# Patient Record
Sex: Male | Born: 1980 | Race: Black or African American | Hispanic: No | Marital: Single | Smoking: Current every day smoker
Health system: Southern US, Community
[De-identification: ages and names within clinical notes are randomized; demographics above are authoritative.]

## PROBLEM LIST (undated history)

## (undated) ENCOUNTER — Ambulatory Visit (HOSPITAL_COMMUNITY): Payer: Medicare Other

## (undated) DIAGNOSIS — E119 Type 2 diabetes mellitus without complications: Secondary | ICD-10-CM

---

## 2000-06-22 ENCOUNTER — Emergency Department (HOSPITAL_COMMUNITY): Admission: EM | Admit: 2000-06-22 | Discharge: 2000-06-23 | Payer: Self-pay | Admitting: Emergency Medicine

## 2000-07-03 ENCOUNTER — Emergency Department (HOSPITAL_COMMUNITY): Admission: EM | Admit: 2000-07-03 | Discharge: 2000-07-03 | Payer: Self-pay | Admitting: Emergency Medicine

## 2000-07-19 ENCOUNTER — Emergency Department (HOSPITAL_COMMUNITY): Admission: EM | Admit: 2000-07-19 | Discharge: 2000-07-19 | Payer: Self-pay | Admitting: Emergency Medicine

## 2000-09-30 ENCOUNTER — Emergency Department (HOSPITAL_COMMUNITY): Admission: EM | Admit: 2000-09-30 | Discharge: 2000-09-30 | Payer: Self-pay | Admitting: Emergency Medicine

## 2000-10-03 ENCOUNTER — Emergency Department (HOSPITAL_COMMUNITY): Admission: EM | Admit: 2000-10-03 | Discharge: 2000-10-03 | Payer: Self-pay | Admitting: Emergency Medicine

## 2000-10-03 ENCOUNTER — Encounter: Payer: Self-pay | Admitting: Emergency Medicine

## 2000-10-04 ENCOUNTER — Emergency Department (HOSPITAL_COMMUNITY): Admission: EM | Admit: 2000-10-04 | Discharge: 2000-10-04 | Payer: Self-pay | Admitting: Emergency Medicine

## 2001-01-31 ENCOUNTER — Emergency Department (HOSPITAL_COMMUNITY): Admission: EM | Admit: 2001-01-31 | Discharge: 2001-01-31 | Payer: Self-pay | Admitting: Emergency Medicine

## 2001-12-29 ENCOUNTER — Emergency Department (HOSPITAL_COMMUNITY): Admission: EM | Admit: 2001-12-29 | Discharge: 2001-12-29 | Payer: Self-pay | Admitting: Emergency Medicine

## 2001-12-29 ENCOUNTER — Encounter: Payer: Self-pay | Admitting: Emergency Medicine

## 2014-10-06 ENCOUNTER — Encounter: Payer: Self-pay | Admitting: Dietician

## 2014-11-03 ENCOUNTER — Ambulatory Visit: Payer: Self-pay | Admitting: Dietician

## 2014-12-01 ENCOUNTER — Ambulatory Visit: Payer: Self-pay | Admitting: Dietician

## 2016-01-29 ENCOUNTER — Encounter (HOSPITAL_COMMUNITY): Payer: Self-pay | Admitting: Emergency Medicine

## 2016-01-29 ENCOUNTER — Emergency Department (HOSPITAL_COMMUNITY)
Admission: EM | Admit: 2016-01-29 | Discharge: 2016-01-29 | Disposition: A | Discharge status: Home / Self Care | Payer: Medicare Other | Attending: Emergency Medicine | Admitting: Emergency Medicine

## 2016-01-29 ENCOUNTER — Other Ambulatory Visit: Payer: Self-pay

## 2016-01-29 DIAGNOSIS — F17210 Nicotine dependence, cigarettes, uncomplicated: Secondary | ICD-10-CM | POA: Insufficient documentation

## 2016-01-29 DIAGNOSIS — Z7984 Long term (current) use of oral hypoglycemic drugs: Secondary | ICD-10-CM | POA: Diagnosis not present

## 2016-01-29 DIAGNOSIS — E119 Type 2 diabetes mellitus without complications: Secondary | ICD-10-CM | POA: Insufficient documentation

## 2016-01-29 DIAGNOSIS — Z79899 Other long term (current) drug therapy: Secondary | ICD-10-CM | POA: Insufficient documentation

## 2016-01-29 DIAGNOSIS — R000 Tachycardia, unspecified: Secondary | ICD-10-CM | POA: Insufficient documentation

## 2016-01-29 DIAGNOSIS — I10 Essential (primary) hypertension: Secondary | ICD-10-CM | POA: Diagnosis present

## 2016-01-29 HISTORY — DX: Type 2 diabetes mellitus without complications: E11.9

## 2016-01-29 LAB — CBC WITH DIFFERENTIAL/PLATELET
Basophils Absolute: 0 10*3/uL (ref 0.0–0.1)
Basophils Relative: 0 %
Eosinophils Absolute: 0 10*3/uL (ref 0.0–0.7)
Eosinophils Relative: 0 %
HCT: 43.6 % (ref 39.0–52.0)
Hemoglobin: 15.1 g/dL (ref 13.0–17.0)
Lymphocytes Relative: 18 %
Lymphs Abs: 2.6 10*3/uL (ref 0.7–4.0)
MCH: 27.9 pg (ref 26.0–34.0)
MCHC: 34.6 g/dL (ref 30.0–36.0)
MCV: 80.4 fL (ref 78.0–100.0)
Monocytes Absolute: 0.7 10*3/uL (ref 0.1–1.0)
Monocytes Relative: 5 %
Neutro Abs: 11 10*3/uL — ABNORMAL HIGH (ref 1.7–7.7)
Neutrophils Relative %: 77 %
Platelets: 231 10*3/uL (ref 150–400)
RBC: 5.42 MIL/uL (ref 4.22–5.81)
RDW: 13.9 % (ref 11.5–15.5)
WBC: 14.3 10*3/uL — ABNORMAL HIGH (ref 4.0–10.5)

## 2016-01-29 LAB — BASIC METABOLIC PANEL
Anion gap: 9 (ref 5–15)
BUN: 12 mg/dL (ref 6–20)
CO2: 23 mmol/L (ref 22–32)
Calcium: 9.2 mg/dL (ref 8.9–10.3)
Chloride: 101 mmol/L (ref 101–111)
Creatinine, Ser: 0.98 mg/dL (ref 0.61–1.24)
GFR calc Af Amer: >60 mL/min (ref >60)
GFR calc non Af Amer: >60 mL/min (ref >60)
Glucose, Bld: 184 mg/dL — ABNORMAL HIGH (ref 65–99)
Potassium: 4 mmol/L (ref 3.5–5.1)
Sodium: 133 mmol/L — ABNORMAL LOW (ref 135–145)

## 2016-01-29 LAB — RAPID URINE DRUG SCREEN, HOSP PERFORMED
Amphetamines: NOT DETECTED
Barbiturates: NOT DETECTED
Benzodiazepines: NOT DETECTED
COCAINE: NOT DETECTED
OPIATES: NOT DETECTED
Tetrahydrocannabinol: NOT DETECTED

## 2016-01-29 LAB — CBG MONITORING, ED: Glucose-Capillary: 191 mg/dL — ABNORMAL HIGH (ref 65–99)

## 2016-01-29 LAB — TSH: TSH: 1.637 u[IU]/mL (ref 0.350–4.500)

## 2016-01-29 NOTE — ED Provider Notes (Signed)
CSN: [id number]     Arrival date & time 01/29/16  1212 History   First MD Initiated Contact with Patient 01/29/16 1220     Chief Complaint  Patient presents with  . Hypertension    Nicholas Carson is a 35 y.o. male with a history of Schizophrenia and diabetes who presents to the emergency department complaining of high blood pressure reading today. Patient reports he was at home and had a headache and his mother had him check his blood pressure. He had a wrist blood pressure cuff and his blood pressure was over 200/100. He then came to the emergency department. Currently he has no complaints. He reports his headache resolved. He denies any chest pain or shortness of breath or palpitations. No history of high blood pressure. He does not take high blood pressure medicines. Patient is a smoker. He reports his blood sugars have been normal recently. He has been compliant with his medications. He has been eating and drinking well. He denies fevers, chest pain, shortness of breath, palpitations, leg pain, abdominal pain, nausea, vomiting, diarrhea, headaches, changes to his vision, or rashes.    Patient is a 35 y.o. male presenting with hypertension. The history is provided by the patient and a relative. No language interpreter was used.  Hypertension Associated symptoms include headaches (resolved ). Pertinent negatives include no abdominal pain, chest pain, chills, congestion, coughing, fever, nausea, neck pain, numbness, rash, sore throat, vomiting or weakness.    Past Medical History  Diagnosis Date  . Diabetes mellitus without complication (HCC)    History reviewed. No pertinent past surgical history. No family history on file. Social History  Substance Use Topics  . Smoking status: Current Every Day Smoker -- 1.00 packs/day    Types: Cigarettes  . Smokeless tobacco: None  . Alcohol Use: No    Review of Systems  Constitutional: Negative for fever and chills.  HENT: Negative for congestion  and sore throat.   Eyes: Negative for pain and visual disturbance.  Respiratory: Negative for cough, shortness of breath and wheezing.   Cardiovascular: Negative for chest pain, palpitations and leg swelling.  Gastrointestinal: Negative for nausea, vomiting, abdominal pain and diarrhea.  Genitourinary: Negative for dysuria.  Musculoskeletal: Negative for back pain and neck pain.  Skin: Negative for rash.  Neurological: Positive for headaches (resolved ). Negative for dizziness, syncope, weakness, light-headedness and numbness.      Allergies  Review of patient's allergies indicates no known allergies.  Home Medications   Prior to Admission medications   Medication Sig Start Date End Date Taking? Authorizing Provider  JANUMET XR 50-1000 MG TB24 Take 1 tablet by mouth 2 (two) times daily. 01/19/16  Yes Historical Provider, MD  risperiDONE (RISPERDAL) 2 MG tablet Take 1-3 mg by mouth 2 (two) times daily. 1 mg qam 3 mg qhs 01/11/16  Yes Historical Provider, MD   BP 125/86 mmHg  Pulse 98  Temp(Src) 98.3 F (36.8 C) (Oral)  Resp 14  SpO2 96% Physical Exam  Constitutional: He is oriented to person, place, and time. He appears well-developed and well-nourished. No distress.  HENT:  Head: Normocephalic and atraumatic.  Right Ear: External ear normal.  Left Ear: External ear normal.  Mouth/Throat: Oropharynx is clear and moist.  Eyes: Conjunctivae are normal. Pupils are equal, round, and reactive to light. Right eye exhibits no discharge. Left eye exhibits no discharge.  Neck: Normal range of motion. Neck supple. No JVD present. No tracheal deviation present.  Cardiovascular: Regular rhythm, normal  heart sounds and intact distal pulses.  Exam reveals no gallop and no friction rub.   No murmur heard. Heart rate is 120. Bilateral radial pulses are intact.  Pulmonary/Chest: Effort normal and breath sounds normal. No stridor. No respiratory distress. He has no wheezes. He has no rales.   Lungs clear to auscultation bilaterally.  Abdominal: Soft. There is no tenderness. There is no guarding.  Musculoskeletal: He exhibits no edema or tenderness.  No lower extremity edema or tenderness.  Lymphadenopathy:    He has no cervical adenopathy.  Neurological: He is alert and oriented to person, place, and time. No cranial nerve deficit. Coordination normal.  Skin: Skin is warm and dry. No rash noted. He is not diaphoretic. No erythema. No pallor.  Psychiatric: His behavior is normal. His mood appears anxious.  Patient appears slightly anxious.   Nursing note and vitals reviewed.   ED Course  Procedures (including critical care time) Labs Review Labs Reviewed  BASIC METABOLIC PANEL - Abnormal; Notable for the following:    Sodium 133 (*)    Glucose, Bld 184 (*)    All other components within normal limits  CBC WITH DIFFERENTIAL/PLATELET - Abnormal; Notable for the following:    WBC 14.3 (*)    Neutro Abs 11.0 (*)    All other components within normal limits  CBG MONITORING, ED - Abnormal; Notable for the following:    Glucose-Capillary 191 (*)    All other components within normal limits  TSH  URINE RAPID DRUG SCREEN, HOSP PERFORMED    Imaging Review No results found. I have personally reviewed and evaluated these images and lab results as part of my medical decision-making.   EKG Interpretation ED ECG REPORT   Date: 01/29/2016  Rate: 106  Rhythm: sinus tachycardia  QRS Axis: normal  Intervals: normal  ST/T Wave abnormalities: early repolarization  Conduction Disutrbances:none  Narrative Interpretation:   Old EKG Reviewed: none available  I have personally reviewed the EKG tracing and agree with the computerized printout as noted.       Filed Vitals:   01/29/16 1226 01/29/16 1311  BP: 125/86   Pulse: 130 98  Temp: 98.3 F (36.8 C)   TempSrc: Oral   Resp: 18 14  SpO2: 99% 96%     MDM   Meds given in ED:  Medications - No data to  display  New Prescriptions   No medications on file    Final diagnoses:  Tachycardia    This is a 35 y.o. male with a history of Schizophrenia and diabetes who presents to the emergency department complaining of high blood pressure reading today. Patient reports he was at home and had a headache and his mother had him check his blood pressure. He had a wrist blood pressure cuff and his blood pressure was over 200/100. He then came to the emergency department. Currently he has no complaints. He reports his headache resolved. He denies any chest pain or shortness of breath or palpitations. No history of high blood pressure. He does not take high blood pressure medicines. Patient is a smoker. He reports his blood sugars have been normal recently. He has been compliant with his medications. He has been eating and drinking well. He denies fevers, chest pain, shortness of breath, palpitations.  On chart review through care everywhere the patient does have multiple vital signs documenting elevated heart rate with heart rates between 98 and 118 at several outpatient family medicine visits.  On exam the patient  is afebrile nontoxic appearing. His lungs clear to auscultation bilaterally. Abdomen soft and nontender. Heart rate is 120. He does appear slightly anxious. He denies feeling anxious. On chart review the patient has not had a TSH. Will check screening EKG, labs and TSH.  BMP is remarkable only for a blood sugar of 184. Normal anion gap. CBC shows a mild leukocytosis with a white count of 14,000. TSH is within normal limits at 1.637. Urine drug screen is unremarkable. No evidence of cocaine use. EKG shows sinus tachycardia. During the patient's ER visit his heart rate improved to 98. At reevaluation patient still has no complaints. Will discharge home and have him follow-up with his primary care provider to have his blood pressure rechecked as well as his heart rate. I discussed return precautions. I  advised the patient to follow-up with their primary care provider this week. I advised the patient to return to the emergency department with new or worsening symptoms or new concerns. The patient verbalized understanding and agreement with plan.    This patient was dicussed with Dr. Particia NearingHaviland who agrees with assessment and plan.    Everlene FarrierWilliam Kathi Dohn, PA-C 01/29/16 1429  Jacalyn LefevreJulie Haviland, MD 01/29/16 [phone]

## 2016-01-29 NOTE — Discharge Instructions (Signed)
Please follow-up with your primary care provider to have your blood pressure and pulse rechecked. Your blood pressure here today was normal to high.  Nonspecific Tachycardia Tachycardia is a faster than normal heartbeat (more than 100 beats per minute). In adults, the heart normally beats between 60 and 100 times a minute. A fast heartbeat may be a normal response to exercise or stress. It does not necessarily mean that something is wrong. However, sometimes when your heart beats too fast it may not be able to pump enough blood to the rest of your body. This can result in chest pain, shortness of breath, dizziness, and even fainting. Nonspecific tachycardia means that the specific cause or pattern of your tachycardia is unknown. CAUSES  Tachycardia may be harmless or it may be due to a more serious underlying cause. Possible causes of tachycardia include:  Exercise or exertion.  Fever.  Pain or injury.  Infection.  Loss of body fluids (dehydration).  Overactive thyroid.  Lack of red blood cells (anemia).  Anxiety and stress.  Alcohol.  Caffeine.  Tobacco products.  Diet pills.  Illegal drugs.  Heart disease. SYMPTOMS  Rapid or irregular heartbeat (palpitations).  Suddenly feeling your heart beating (cardiac awareness).  Dizziness.  Tiredness (fatigue).  Shortness of breath.  Chest pain.  Nausea.  Fainting. DIAGNOSIS  Your caregiver will perform a physical exam and take your medical history. In some cases, a heart specialist (cardiologist) may be consulted. Your caregiver may also order:  Blood tests.  Electrocardiography. This test records the electrical activity of your heart.  A heart monitoring test. TREATMENT  Treatment will depend on the likely cause of your tachycardia. The goal is to treat the underlying cause of your tachycardia. Treatment methods may include:  Replacement of fluids or blood through an intravenous (IV) tube for moderate to severe  dehydration or anemia.  New medicines or changes in your current medicines.  Diet and lifestyle changes.  Treatment for certain infections.  Stress relief or relaxation methods. HOME CARE INSTRUCTIONS   Rest.  Drink enough fluids to keep your urine clear or pale yellow.  Do not smoke.  Avoid:  Caffeine.  Tobacco.  Alcohol.  Chocolate.  Stimulants such as over-the-counter diet pills or pills that help you stay awake.  Situations that cause anxiety or stress.  Illegal drugs such as marijuana, phencyclidine (PCP), and cocaine.  Only take medicine as directed by your caregiver.  Keep all follow-up appointments as directed by your caregiver. SEEK IMMEDIATE MEDICAL CARE IF:   You have pain in your chest, upper arms, jaw, or neck.  You become weak, dizzy, or feel faint.  You have palpitations that will not go away.  You vomit, have diarrhea, or pass blood in your stool.  Your skin is cool, pale, and wet.  You have a fever that will not go away with rest, fluids, and medicine. MAKE SURE YOU:   Understand these instructions.  Will watch your condition.  Will get help right away if you are not doing well or get worse.   This information is not intended to replace advice given to you by your health care provider. Make sure you discuss any questions you have with your health care provider.   Document Released: 08/31/2004 Document Revised: 10/16/2011 Document Reviewed: 02/05/2015 Elsevier Interactive Patient Education [employer]2016 [employer] Inc.

## 2016-01-29 NOTE — ED Notes (Addendum)
Pt c/o high blood pressure (237/187) after "smoking some cigarettes and eating some bacon." Denies chest pain or SOB. Current BP 125/86. Pt denies hx of hypertension.

## 2016-01-29 NOTE — ED Notes (Signed)
Discharge instructions and follow up care reviewed with patient. Patient verbalized understanding. 

## 2017-05-23 ENCOUNTER — Inpatient Hospital Stay (HOSPITAL_COMMUNITY)
Admission: EM | Admit: 2017-05-23 | Discharge: 2017-05-26 | DRG: 917 | Disposition: A | Discharge status: Home / Self Care | Payer: Medicare Other | Attending: Family Medicine | Admitting: Family Medicine

## 2017-05-23 ENCOUNTER — Encounter (HOSPITAL_COMMUNITY): Payer: Self-pay | Admitting: Emergency Medicine

## 2017-05-23 ENCOUNTER — Emergency Department (HOSPITAL_COMMUNITY): Payer: Medicare Other

## 2017-05-23 ENCOUNTER — Inpatient Hospital Stay (HOSPITAL_COMMUNITY): Payer: Medicare Other

## 2017-05-23 DIAGNOSIS — T39091A Poisoning by salicylates, accidental (unintentional), initial encounter: Principal | ICD-10-CM | POA: Diagnosis present

## 2017-05-23 DIAGNOSIS — E119 Type 2 diabetes mellitus without complications: Secondary | ICD-10-CM

## 2017-05-23 DIAGNOSIS — N179 Acute kidney failure, unspecified: Secondary | ICD-10-CM | POA: Diagnosis not present

## 2017-05-23 DIAGNOSIS — F200 Paranoid schizophrenia: Secondary | ICD-10-CM

## 2017-05-23 DIAGNOSIS — D72829 Elevated white blood cell count, unspecified: Secondary | ICD-10-CM | POA: Diagnosis present

## 2017-05-23 DIAGNOSIS — J9600 Acute respiratory failure, unspecified whether with hypoxia or hypercapnia: Secondary | ICD-10-CM | POA: Diagnosis present

## 2017-05-23 DIAGNOSIS — Z01818 Encounter for other preprocedural examination: Secondary | ICD-10-CM

## 2017-05-23 DIAGNOSIS — E1165 Type 2 diabetes mellitus with hyperglycemia: Secondary | ICD-10-CM | POA: Diagnosis present

## 2017-05-23 DIAGNOSIS — E876 Hypokalemia: Secondary | ICD-10-CM | POA: Diagnosis not present

## 2017-05-23 DIAGNOSIS — E875 Hyperkalemia: Secondary | ICD-10-CM | POA: Diagnosis present

## 2017-05-23 DIAGNOSIS — Z6837 Body mass index (BMI) 37.0-37.9, adult: Secondary | ICD-10-CM | POA: Diagnosis not present

## 2017-05-23 DIAGNOSIS — R000 Tachycardia, unspecified: Secondary | ICD-10-CM | POA: Diagnosis present

## 2017-05-23 DIAGNOSIS — R4182 Altered mental status, unspecified: Secondary | ICD-10-CM | POA: Diagnosis present

## 2017-05-23 DIAGNOSIS — I10 Essential (primary) hypertension: Secondary | ICD-10-CM | POA: Diagnosis present

## 2017-05-23 DIAGNOSIS — E669 Obesity, unspecified: Secondary | ICD-10-CM | POA: Diagnosis present

## 2017-05-23 DIAGNOSIS — E874 Mixed disorder of acid-base balance: Secondary | ICD-10-CM | POA: Diagnosis present

## 2017-05-23 DIAGNOSIS — E781 Pure hyperglyceridemia: Secondary | ICD-10-CM | POA: Diagnosis present

## 2017-05-23 DIAGNOSIS — G9341 Metabolic encephalopathy: Secondary | ICD-10-CM | POA: Diagnosis present

## 2017-05-23 DIAGNOSIS — J9601 Acute respiratory failure with hypoxia: Secondary | ICD-10-CM

## 2017-05-23 DIAGNOSIS — T39095A Adverse effect of salicylates, initial encounter: Secondary | ICD-10-CM

## 2017-05-23 DIAGNOSIS — W010XXA Fall on same level from slipping, tripping and stumbling without subsequent striking against object, initial encounter: Secondary | ICD-10-CM | POA: Diagnosis present

## 2017-05-23 DIAGNOSIS — E872 Acidosis: Secondary | ICD-10-CM | POA: Diagnosis not present

## 2017-05-23 DIAGNOSIS — Z79899 Other long term (current) drug therapy: Secondary | ICD-10-CM | POA: Diagnosis not present

## 2017-05-23 DIAGNOSIS — Z7984 Long term (current) use of oral hypoglycemic drugs: Secondary | ICD-10-CM

## 2017-05-23 DIAGNOSIS — F17210 Nicotine dependence, cigarettes, uncomplicated: Secondary | ICD-10-CM | POA: Diagnosis present

## 2017-05-23 DIAGNOSIS — Z9911 Dependence on respirator [ventilator] status: Secondary | ICD-10-CM

## 2017-05-23 DIAGNOSIS — R739 Hyperglycemia, unspecified: Secondary | ICD-10-CM

## 2017-05-23 LAB — BASIC METABOLIC PANEL
Anion gap: 11 (ref 5–15)
BUN: 12 mg/dL (ref 6–20)
CALCIUM: 7.4 mg/dL — AB (ref 8.9–10.3)
CO2: 29 mmol/L (ref 22–32)
Chloride: 100 mmol/L — ABNORMAL LOW (ref 101–111)
Creatinine, Ser: 1.46 mg/dL — ABNORMAL HIGH (ref 0.61–1.24)
GFR calc Af Amer: >60 mL/min (ref >60)
GFR, EST NON AFRICAN AMERICAN: 60 mL/min — AB (ref >60)
GLUCOSE: 133 mg/dL — AB (ref 65–99)
Potassium: 2.4 mmol/L — CL (ref 3.5–5.1)
Sodium: 140 mmol/L (ref 135–145)

## 2017-05-23 LAB — URINALYSIS, ROUTINE W REFLEX MICROSCOPIC
BILIRUBIN URINE: NEGATIVE
BILIRUBIN URINE: NEGATIVE
Bacteria, UA: NONE SEEN
GLUCOSE, UA: 50 mg/dL — AB
GLUCOSE, UA: NEGATIVE mg/dL
HGB URINE DIPSTICK: NEGATIVE
KETONES UR: 5 mg/dL — AB
Ketones, ur: 20 mg/dL — AB
LEUKOCYTES UA: NEGATIVE
Leukocytes, UA: NEGATIVE
NITRITE: NEGATIVE
NITRITE: NEGATIVE
PH: 5 (ref 5.0–8.0)
Protein, ur: 30 mg/dL — AB
Protein, ur: 100 mg/dL — AB
SPECIFIC GRAVITY, URINE: 1.024 (ref 1.005–1.030)
Specific Gravity, Urine: 1.036 — ABNORMAL HIGH (ref 1.005–1.030)
pH: 5 (ref 5.0–8.0)

## 2017-05-23 LAB — COMPREHENSIVE METABOLIC PANEL
ALK PHOS: 57 U/L (ref 38–126)
ALT: 22 U/L (ref 17–63)
ALT: 20 U/L (ref 17–63)
ANION GAP: 19 — AB (ref 5–15)
AST: 29 U/L (ref 15–41)
AST: 16 U/L (ref 15–41)
Albumin: 3.6 g/dL (ref 3.5–5.0)
Albumin: 3.1 g/dL — ABNORMAL LOW (ref 3.5–5.0)
Alkaline Phosphatase: 47 U/L (ref 38–126)
Anion gap: 14 (ref 5–15)
BILIRUBIN TOTAL: 0.9 mg/dL (ref 0.3–1.2)
BILIRUBIN TOTAL: 0.5 mg/dL (ref 0.3–1.2)
BUN: 14 mg/dL (ref 6–20)
BUN: 14 mg/dL (ref 6–20)
CALCIUM: 8.8 mg/dL — AB (ref 8.9–10.3)
CO2: 15 mmol/L — ABNORMAL LOW (ref 22–32)
CO2: 25 mmol/L (ref 22–32)
CREATININE: 1.49 mg/dL — AB (ref 0.61–1.24)
Calcium: 7.5 mg/dL — ABNORMAL LOW (ref 8.9–10.3)
Chloride: 102 mmol/L (ref 101–111)
Chloride: 105 mmol/L (ref 101–111)
Creatinine, Ser: 1.46 mg/dL — ABNORMAL HIGH (ref 0.61–1.24)
GFR calc Af Amer: >60 mL/min (ref >60)
GFR, EST NON AFRICAN AMERICAN: 60 mL/min — AB (ref >60)
GFR, EST NON AFRICAN AMERICAN: 58 mL/min — AB (ref >60)
Glucose, Bld: 255 mg/dL — ABNORMAL HIGH (ref 65–99)
Glucose, Bld: 191 mg/dL — ABNORMAL HIGH (ref 65–99)
POTASSIUM: 5.3 mmol/L — AB (ref 3.5–5.1)
Potassium: 4.3 mmol/L (ref 3.5–5.1)
Sodium: 136 mmol/L (ref 135–145)
Sodium: 144 mmol/L (ref 135–145)
TOTAL PROTEIN: 7.9 g/dL (ref 6.5–8.1)
TOTAL PROTEIN: 6.8 g/dL (ref 6.5–8.1)

## 2017-05-23 LAB — BLOOD GAS, ARTERIAL
Acid-Base Excess: 1.1 mmol/L (ref 0.0–2.0)
Bicarbonate: 24 mmol/L (ref 20.0–28.0)
DRAWN BY: 51806
FIO2: 40
LHR: 30 {breaths}/min
O2 Saturation: 98.3 %
PEEP: 5 cmH2O
PO2 ART: 128 mmHg — AB (ref 83.0–108.0)
Patient temperature: 98.6
VT: 550 mL
pCO2 arterial: 30.5 mmHg — ABNORMAL LOW (ref 32.0–48.0)
pH, Arterial: 7.507 — ABNORMAL HIGH (ref 7.350–7.450)

## 2017-05-23 LAB — I-STAT ARTERIAL BLOOD GAS, ED
Acid-Base Excess: 3 mmol/L — ABNORMAL HIGH (ref 0.0–2.0)
Bicarbonate: 28.9 mmol/L — ABNORMAL HIGH (ref 20.0–28.0)
O2 Saturation: 100 %
PCO2 ART: 49.3 mmHg — AB (ref 32.0–48.0)
Patient temperature: 98.7
TCO2: 30 mmol/L (ref 22–32)
pH, Arterial: 7.376 (ref 7.350–7.450)
pO2, Arterial: 465 mmHg — ABNORMAL HIGH (ref 83.0–108.0)

## 2017-05-23 LAB — GLUCOSE, CAPILLARY
Glucose-Capillary: 166 mg/dL — ABNORMAL HIGH (ref 65–99)
Glucose-Capillary: 134 mg/dL — ABNORMAL HIGH (ref 65–99)
Glucose-Capillary: 135 mg/dL — ABNORMAL HIGH (ref 65–99)

## 2017-05-23 LAB — CBC WITH DIFFERENTIAL/PLATELET
BASOS ABS: 0 10*3/uL (ref 0.0–0.1)
BASOS PCT: 0 %
Eosinophils Absolute: 0 10*3/uL (ref 0.0–0.7)
Eosinophils Relative: 0 %
HEMATOCRIT: 46.9 % (ref 39.0–52.0)
Hemoglobin: 15.6 g/dL (ref 13.0–17.0)
LYMPHS PCT: 5 %
Lymphs Abs: 0.9 10*3/uL (ref 0.7–4.0)
MCH: 27.6 pg (ref 26.0–34.0)
MCHC: 33.3 g/dL (ref 30.0–36.0)
MCV: 82.9 fL (ref 78.0–100.0)
MONO ABS: 0.3 10*3/uL (ref 0.1–1.0)
Monocytes Relative: 2 %
NEUTROS ABS: 18.2 10*3/uL — AB (ref 1.7–7.7)
Neutrophils Relative %: 93 %
PLATELETS: 309 10*3/uL (ref 150–400)
RBC: 5.66 MIL/uL (ref 4.22–5.81)
RDW: 15.9 % — AB (ref 11.5–15.5)
WBC: 19.5 10*3/uL — AB (ref 4.0–10.5)

## 2017-05-23 LAB — MRSA PCR SCREENING: MRSA BY PCR: NEGATIVE

## 2017-05-23 LAB — I-STAT CG4 LACTIC ACID, ED: Lactic Acid, Venous: 2.48 mmol/L (ref 0.5–1.9)

## 2017-05-23 LAB — I-STAT VENOUS BLOOD GAS, ED
ACID-BASE DEFICIT: 3 mmol/L — AB (ref 0.0–2.0)
Bicarbonate: 17.8 mmol/L — ABNORMAL LOW (ref 20.0–28.0)
O2 SAT: 97 %
PCO2 VEN: 23.6 mmHg — AB (ref 44.0–60.0)
PO2 VEN: 78 mmHg — AB (ref 32.0–45.0)
TCO2: 18 mmol/L — ABNORMAL LOW (ref 22–32)
pH, Ven: 7.485 — ABNORMAL HIGH (ref 7.250–7.430)

## 2017-05-23 LAB — ACETAMINOPHEN LEVEL: Acetaminophen (Tylenol), Serum: <10 ug/mL — ABNORMAL LOW (ref 10–30)

## 2017-05-23 LAB — RAPID URINE DRUG SCREEN, HOSP PERFORMED
AMPHETAMINES: NOT DETECTED
BARBITURATES: NOT DETECTED
Benzodiazepines: NOT DETECTED
Cocaine: NOT DETECTED
Opiates: NOT DETECTED
Tetrahydrocannabinol: NOT DETECTED

## 2017-05-23 LAB — CBG MONITORING, ED: Glucose-Capillary: 272 mg/dL — ABNORMAL HIGH (ref 65–99)

## 2017-05-23 LAB — SALICYLATE LEVEL
SALICYLATE LVL: 116.7 mg/dL — AB (ref 2.8–30.0)
SALICYLATE LVL: 62.6 mg/dL — AB (ref 2.8–30.0)
Salicylate Lvl: 98.3 mg/dL (ref 2.8–30.0)
Salicylate Lvl: 46.9 mg/dL (ref 2.8–30.0)

## 2017-05-23 LAB — ETHANOL

## 2017-05-23 LAB — PROTIME-INR
INR: 1.89
Prothrombin Time: 21.5 seconds — ABNORMAL HIGH (ref 11.4–15.2)

## 2017-05-23 LAB — AMMONIA: AMMONIA: 49 umol/L — AB (ref 9–35)

## 2017-05-23 LAB — OSMOLALITY: OSMOLALITY: 312 mosm/kg — AB (ref 275–295)

## 2017-05-23 LAB — TSH: TSH: 0.188 u[IU]/mL — ABNORMAL LOW (ref 0.350–4.500)

## 2017-05-23 LAB — I-STAT TROPONIN, ED: TROPONIN I, POC: 0 ng/mL (ref 0.00–0.08)

## 2017-05-23 LAB — MAGNESIUM: Magnesium: 2.5 mg/dL — ABNORMAL HIGH (ref 1.7–2.4)

## 2017-05-23 LAB — TRIGLYCERIDES: Triglycerides: 134 mg/dL (ref <150)

## 2017-05-23 LAB — CK: Total CK: 375 U/L (ref 49–397)

## 2017-05-23 MED ORDER — FENTANYL CITRATE (PF) 100 MCG/2ML IJ SOLN
200.0000 ug | Freq: Once | INTRAMUSCULAR | Status: DC
  Filled 2017-05-23: qty 4

## 2017-05-23 MED ORDER — HEPARIN SODIUM (PORCINE) 5000 UNIT/ML IJ SOLN
5000.0000 [IU] | Freq: Three times a day (TID) | INTRAMUSCULAR | Status: DC
  Administered 2017-05-23 – 2017-05-26 (×9): 5000 [IU] via SUBCUTANEOUS
  Filled 2017-05-23 (×9): qty 1

## 2017-05-23 MED ORDER — PENTAFLUOROPROP-TETRAFLUOROETH EX AERO: 1.0000 "application " | INHALATION_SPRAY | CUTANEOUS | Status: DC | PRN

## 2017-05-23 MED ORDER — SODIUM BICARBONATE 8.4 % IV SOLN
150.0000 meq | Freq: Once | INTRAVENOUS | Status: AC
  Administered 2017-05-23: 150 meq via INTRAVENOUS

## 2017-05-23 MED ORDER — FENTANYL CITRATE (PF) 100 MCG/2ML IJ SOLN
INTRAMUSCULAR | Status: AC
  Filled 2017-05-23: qty 4

## 2017-05-23 MED ORDER — FENTANYL CITRATE (PF) 100 MCG/2ML IJ SOLN
50.0000 ug | Freq: Once | INTRAMUSCULAR | Status: AC
  Administered 2017-05-23: 50 ug via INTRAVENOUS

## 2017-05-23 MED ORDER — FENTANYL BOLUS VIA INFUSION
50.0000 ug | INTRAVENOUS | Status: DC | PRN
  Filled 2017-05-23: qty 50

## 2017-05-23 MED ORDER — SODIUM CHLORIDE 0.9% FLUSH
10.0000 mL | Freq: Two times a day (BID) | INTRAVENOUS | Status: DC
  Administered 2017-05-23 – 2017-05-26 (×5): 10 mL

## 2017-05-23 MED ORDER — LIDOCAINE-PRILOCAINE 2.5-2.5 % EX CREA: 1.0000 "application " | TOPICAL_CREAM | CUTANEOUS | Status: DC | PRN

## 2017-05-23 MED ORDER — LORAZEPAM 2 MG/ML IJ SOLN
INTRAMUSCULAR | Status: AC
  Filled 2017-05-23: qty 1

## 2017-05-23 MED ORDER — SODIUM CHLORIDE 0.9 % IV BOLUS (SEPSIS)
1000.0000 mL | Freq: Once | INTRAVENOUS | Status: AC
  Administered 2017-05-23: 1000 mL via INTRAVENOUS

## 2017-05-23 MED ORDER — SODIUM CHLORIDE 0.9% FLUSH
10.0000 mL | INTRAVENOUS | Status: DC | PRN
Start: 2017-05-23 — End: 2017-05-26

## 2017-05-23 MED ORDER — MIDAZOLAM HCL 2 MG/2ML IJ SOLN: 4.0000 mg | Freq: Once | INTRAMUSCULAR | Status: DC

## 2017-05-23 MED ORDER — SODIUM CHLORIDE 0.9 % IV SOLN: 250.0000 mL | INTRAVENOUS | Status: DC | PRN

## 2017-05-23 MED ORDER — ALTEPLASE 2 MG IJ SOLR: 2.0000 mg | Freq: Once | INTRAMUSCULAR | Status: DC | PRN

## 2017-05-23 MED ORDER — LORAZEPAM 2 MG/ML IJ SOLN
INTRAMUSCULAR | Status: AC
  Administered 2017-05-23: 0.5 mg
  Filled 2017-05-23: qty 1

## 2017-05-23 MED ORDER — HEPARIN SODIUM (PORCINE) 1000 UNIT/ML DIALYSIS: 1000.0000 [IU] | INTRAMUSCULAR | Status: DC | PRN

## 2017-05-23 MED ORDER — FENTANYL 2500MCG IN NS 250ML (10MCG/ML) PREMIX INFUSION
25.0000 ug/h | INTRAVENOUS | Status: DC
  Administered 2017-05-23: 50 ug/h via INTRAVENOUS
  Filled 2017-05-23 (×2): qty 250

## 2017-05-23 MED ORDER — SODIUM CHLORIDE 0.9 % IV SOLN: 100.0000 mL | INTRAVENOUS | Status: DC | PRN

## 2017-05-23 MED ORDER — CHLORHEXIDINE GLUCONATE CLOTH 2 % EX PADS
6.0000 | MEDICATED_PAD | Freq: Every day | CUTANEOUS | Status: DC
  Administered 2017-05-23 – 2017-05-24 (×2): 6 via TOPICAL

## 2017-05-23 MED ORDER — PROPOFOL 1000 MG/100ML IV EMUL
INTRAVENOUS | Status: AC
  Filled 2017-05-23: qty 100

## 2017-05-23 MED ORDER — SODIUM BICARBONATE 8.4 % IV SOLN
INTRAVENOUS | Status: AC
  Administered 2017-05-23: 12:00:00
  Filled 2017-05-23: qty 150

## 2017-05-23 MED ORDER — POTASSIUM CHLORIDE 10 MEQ/50ML IV SOLN
10.0000 meq | INTRAVENOUS | Status: AC
  Administered 2017-05-24 (×4): 10 meq via INTRAVENOUS
  Filled 2017-05-23 (×4): qty 50

## 2017-05-23 MED ORDER — MIDAZOLAM HCL 5 MG/5ML IJ SOLN
INTRAMUSCULAR | Status: DC | PRN
  Administered 2017-05-23 (×2): 2 mg via INTRAVENOUS

## 2017-05-23 MED ORDER — POTASSIUM CHLORIDE 20 MEQ/15ML (10%) PO SOLN
20.0000 meq | Freq: Once | ORAL | Status: AC
  Administered 2017-05-23: 20 meq via ORAL
  Filled 2017-05-23: qty 15

## 2017-05-23 MED ORDER — CHLORHEXIDINE GLUCONATE 0.12% ORAL RINSE (MEDLINE KIT)
15.0000 mL | Freq: Two times a day (BID) | OROMUCOSAL | Status: DC
  Administered 2017-05-23 – 2017-05-24 (×2): 15 mL via OROMUCOSAL

## 2017-05-23 MED ORDER — FENTANYL CITRATE (PF) 100 MCG/2ML IJ SOLN
INTRAMUSCULAR | Status: DC | PRN
  Administered 2017-05-23 (×3): 100 ug via INTRAVENOUS

## 2017-05-23 MED ORDER — MIDAZOLAM HCL 2 MG/2ML IJ SOLN
INTRAMUSCULAR | Status: AC
  Filled 2017-05-23: qty 4

## 2017-05-23 MED ORDER — PROPOFOL 1000 MG/100ML IV EMUL
5.0000 ug/kg/min | INTRAVENOUS | Status: DC
  Administered 2017-05-23: 30 ug/kg/min via INTRAVENOUS
  Administered 2017-05-23: 60 ug/kg/min via INTRAVENOUS
  Administered 2017-05-23: 65 ug/kg/min via INTRAVENOUS
  Administered 2017-05-23 – 2017-05-24 (×3): 30 ug/kg/min via INTRAVENOUS
  Filled 2017-05-23 (×5): qty 100

## 2017-05-23 MED ORDER — SODIUM BICARBONATE 8.4 % IV SOLN
INTRAVENOUS | Status: DC
  Administered 2017-05-23 – 2017-05-24 (×5): via INTRAVENOUS
  Filled 2017-05-23 (×11): qty 150

## 2017-05-23 MED ORDER — LIDOCAINE HCL (PF) 1 % IJ SOLN: 5.0000 mL | INTRAMUSCULAR | Status: DC | PRN

## 2017-05-23 MED ORDER — PANTOPRAZOLE SODIUM 40 MG IV SOLR
40.0000 mg | Freq: Every day | INTRAVENOUS | Status: DC
  Administered 2017-05-23 – 2017-05-24 (×2): 40 mg via INTRAVENOUS
  Filled 2017-05-23 (×2): qty 40

## 2017-05-23 MED ORDER — ORAL CARE MOUTH RINSE
15.0000 mL | Freq: Four times a day (QID) | OROMUCOSAL | Status: DC
  Administered 2017-05-23 – 2017-05-24 (×5): 15 mL via OROMUCOSAL

## 2017-05-23 NOTE — ED Provider Notes (Signed)
[facility] EMERGENCY DEPARTMENT Provider Note   CSN: [id number] Arrival date & time: 05/23/17  1001     History   Chief Complaint No chief complaint on file.   HPI Nicholas Carson is a 36 y.o. male.  HPI   Patient is a 36 year old male presenting with altered mental status. Patient was found crossing a street. He was stumbling across the street and fell at least one time witnessed by bystanders. Patient brought in by EMS. They report that he could remember his name and nor his date of birth or any other information about himself. According to police who is also with him,  he has had over 50 addresses in the last 10 years. He has had multiple IVC  out for him for psychiatric issues in the past.As well as multiple calls for indecent exposure.  On arrival patient unable to answer any questions. Protecting airway. Level 5 CAVEAT altered mental status.  Past Medical History:  Diagnosis Date  . Diabetes mellitus without complication (HCC)     There are no active problems to display for this patient.   History reviewed. No pertinent surgical history.     Home Medications    Prior to Admission medications   Medication Sig Start Date End Date Taking? Authorizing Provider  JANUMET XR 50-1000 MG TB24 Take 1 tablet by mouth 2 (two) times daily. 01/19/16   [provider]  risperiDONE (RISPERDAL) 2 MG tablet Take 1-3 mg by mouth 2 (two) times daily. 1 mg qam 3 mg qhs 01/11/16   [provider]    Family History History reviewed. No pertinent family history.  Social History Social History  Substance Use Topics  . Smoking status: Current Every Day Smoker    Packs/day: 1.00    Types: Cigarettes  . Smokeless tobacco: Never Used  . Alcohol use No     Allergies   Patient has no known allergies.   Review of Systems Review of Systems  Unable to perform ROS: Mental status change     Physical Exam Updated Vital Signs There were no vitals  taken for this visit.  Physical Exam  Constitutional: He appears well-nourished.  Morbidly obese diaphoretic male.  HENT:  Head: Normocephalic and atraumatic.  Mid sized pupils  Eyes: Conjunctivae are normal. Right eye exhibits no discharge. Left eye exhibits no discharge.  Cardiovascular: Normal rate and regular rhythm.   No murmur heard. Pulmonary/Chest: Effort normal and breath sounds normal. No respiratory distress. He has no wheezes.  Abdominal: Soft. He exhibits no distension. There is no tenderness.  Neurological: He is alert. No cranial nerve deficit.  Patient has snoring respirations however if you ask him to squeeze his hands he will do so and he will answer questions in a whisper voice.he  Can move all 4 extremities. No cranial nerve deficits. Mild nystagmus However when not being talked to patient is back to snoring respirations.  No clonus. Patient does have tongue fasciculations.  Skin: Skin is warm and dry. He is not diaphoretic.     ED Treatments / Results  Labs (all labs ordered are listed, but only abnormal results are displayed) Labs Reviewed  CBG MONITORING, ED - Abnormal; Notable for the following:       Result Value   Glucose-Capillary 272 (*)    All other components within normal limits  COMPREHENSIVE METABOLIC PANEL  CBC WITH DIFFERENTIAL/PLATELET  URINALYSIS, ROUTINE W REFLEX MICROSCOPIC  RAPID URINE DRUG SCREEN, HOSP PERFORMED  AMMONIA  ETHANOL  I-STAT CG4 LACTIC ACID, ED  I-STAT TROPONIN, ED  I-STAT VENOUS BLOOD GAS, ED    EKG  EKG Interpretation  Date/Time:  Wednesday May 23 2017 10:06:05 EDT Ventricular Rate:  137 PR Interval:    QRS Duration: 83 QT Interval:  279 QTC Calculation: 422 R Axis:   86 Text Interpretation:  Sinus tachycardia Consider right atrial enlargement Borderline T abnormalities, inferior leads tachycardia noted.  Confirmed by Bary CastillaMackuen, Courteney ([id number]) on 05/23/2017 10:21:01 AM       Radiology No results  found.  Procedures Procedures (including critical care time)  CRITICAL CARE Performed by: Arlana Hoveourteney L MacKuen Total critical care time: 90  minutes Critical care time was exclusive of separately billable procedures and treating other patients. Critical care was necessary to treat or prevent imminent or life-threatening deterioration. Critical care was time spent personally by me on the following activities: development of treatment plan with patient and/or surrogate as well as nursing, discussions with consultants, evaluation of patient's response to treatment, examination of patient, obtaining history from patient or surrogate, ordering and performing treatments and interventions, ordering and review of laboratory studies, ordering and review of radiographic studies, pulse oximetry and re-evaluation of patient's condition.   Medications Ordered in ED Medications - No data to display   Initial Impression / Assessment and Plan / ED Course  I have reviewed the triage vital signs and the nursing notes.  Pertinent labs & imaging results that were available during my care of the patient were reviewed by me and considered in my medical decision making (see chart for details).     We'll do and workup on this patient. Started with head CT. We'll get labs, drugs abuse screen etc. I suspect some kind of toxic ingestion.  Differential includes the patient is post ictal, toxic ingestion,that he is undergoing alcohol withdrawal here, or sympathomimetic use.   Patient is tachycardic, hypertensive, diaphoretic, however with normal size pupils and decreased mental status. No clonus. The patient opens his mouth he does have tongue fasciiculations that they almost appear volitional.This doesn't match up with any classic toxidrome including anticholinergic syndrome (usually bradycardic), sympathomemetic use (usually agitation) patient also does not have increased lacrimation, only diaphoresis. I tried  attempted multiple times to talk to patient about what he could've taken but he refuses to answer. Patient is able to tell me his name and mouth words but is otherwise not indicating.  Other considerations are thyroid storm, however patient had normal TSH in the last several years.  Comment things being common, I will attempt treatment with benzos for alcohol withdrawal state or mixed toxidrome. Since patient is diaphoretic tachycardic with tongue fasciculations.  12:19 PM Salycilate level just came back at >117.  Called poison control, gave 3 bicarb, ordered drip. Called dialysis for emergent dialysis. Called crit care.   Found pt's grandmother's number and called her to come to ED.   Crit care intubated and placed udoll for emergent dialysis. . Will admit.     Final Clinical Impressions(s) / ED Diagnoses   Final diagnoses:  None    New Prescriptions New Prescriptions   No medications on file     Abelino DerrickMackuen, Courteney Lyn, MD 05/23/17 1610

## 2017-05-23 NOTE — Progress Notes (Signed)
RT note:  RT called to room due to patient needing intubation, MD intubated patient with no apparent complications, no distress noted, intubation successful. Color change positive via capnography, bilateral breath sounds. Patient on full support, RT will continue to monitor.

## 2017-05-23 NOTE — Procedures (Signed)
Arterial Catheter Insertion Procedure Note Cleotis LemaDaniel Seelbach [id number] 07-26-1981  Procedure: Insertion of Arterial Catheter  Indications: Blood pressure monitoring and Frequent blood sampling  Procedure Details Consent: Unable to obtain consent because of emergent medical necessity. Time Out: Verified patient identification, verified procedure, site/side was marked, verified correct patient position, special equipment/implants available, medications/allergies/relevent history reviewed, required imaging and test results available.  Performed  Maximum sterile technique was used including antiseptics, cap, gloves, gown, hand hygiene, mask and sheet. Skin prep: Chlorhexidine; local anesthetic administered 20 gauge catheter was inserted into left radial artery using the Seldinger technique.  Evaluation Blood flow good; BP tracing good. Complications: No apparent complications.   Nelda BucksFEINSTEIN,Timmothy J. 05/23/2017  US  Mcarthur Rossettianiel J. Tyson AliasFeinstein, MD, FACP Pgr: [phone] [facility]

## 2017-05-23 NOTE — Progress Notes (Signed)
RT and RN transported patient from ED to 2M04 on vent. No apparent complications or signs of distress. Patient vitals are stable, RN at bedside. RT will continue to monitor.

## 2017-05-23 NOTE — ED Notes (Signed)
After initial 0.5 mg ativan dose patient profuse sweating decreased. HR down to 135.

## 2017-05-23 NOTE — ED Notes (Signed)
CRITICAL VALUE ALERT  Critical Value:  Salicylate 116.7  Date & Time Notied:  05/23/2017  Provider Notified: MD Corlis LeakMackuen   Orders Received/Actions taken: pending, poison control being notified for further recommendations

## 2017-05-23 NOTE — Procedures (Addendum)
Intubation Procedure Note Shravan Salahuddin [id number] 11/22/1980  Procedure: Intubation Indications: emergent hd  resp failure, no airway prtection  Procedure Details Consent: Unable to obtain consent because of emergent medical necessity. Time Out: Verified patient identification, verified procedure, site/side was marked, verified correct patient position, special equipment/implants available, medications/allergies/relevent history reviewed, required imaging and test results available.  Performed  Maximum sterile technique was used including antiseptics, cap, gloves, gown, hand hygiene, mask and sheet.  MAC and 4    Evaluation Hemodynamic Status: BP stable throughout; O2 sats: stable throughout Patient's Current Condition: stable Complications: No apparent complications Patient did tolerate procedure well. Chest X-ray ordered to verify placement.  CXR: pending.   Raylene Miyamoto 05/23/2017

## 2017-05-23 NOTE — Code Documentation (Signed)
Dr. Tyson AliasFeinstein at bedside preparing to intubate patient. Grandmother is present.

## 2017-05-23 NOTE — Procedures (Signed)
I was present at this dialysis session. I have reviewed the session itself and made appropriate changes.   Filed Weights   05/23/17 1200 05/23/17 1451  Weight: 117.9 kg (260 lb) 122.2 kg (269 lb 6.4 oz)     Recent Labs Lab 05/23/17 1011  NA 136  K 5.3*  CL 102  CO2 15*  GLUCOSE 255*  BUN 14  CREATININE 1.46*  CALCIUM 8.8*     Recent Labs Lab 05/23/17 1011  WBC 19.5*  NEUTROABS 18.2*  HGB 15.6  HCT 46.9  MCV 82.9  PLT 309    Scheduled Meds: . chlorhexidine gluconate (MEDLINE KIT)  15 mL Mouth Rinse BID  . Chlorhexidine Gluconate Cloth  6 each Topical Daily  . fentaNYL (SUBLIMAZE) injection  200 mcg Intravenous Once  . heparin  5,000 Units Subcutaneous Q8H  . LORazepam      . mouth rinse  15 mL Mouth Rinse QID  . midazolam  4 mg Intravenous Once  . pantoprazole (PROTONIX) IV  40 mg Intravenous QHS  . sodium chloride flush  10-40 mL Intracatheter Q12H   Continuous Infusions: . sodium chloride    . fentaNYL infusion INTRAVENOUS 50 mcg/hr (05/23/17 1543)  . propofol (DIPRIVAN) infusion 65 mcg/kg/min (05/23/17 1507)  .  sodium bicarbonate  infusion 1000 mL 250 mL/hr at 05/23/17 1244   PRN Meds:.sodium chloride, fentaNYL, fentaNYL, midazolam, sodium chloride flush    Assessment/Plan: 1. Salicylate intoxication/toxicity with levels >100 with AMS and VDRF.  On emergent HD with bfr of 400, dfr 800 due to normal renal function and maximal clearance of salicylates. Donetta Potts,  MD 05/23/2017, 3:48 PM

## 2017-05-23 NOTE — Progress Notes (Signed)
eLink Physician-Brief Progress Note Patient Name: Nicholas Carson DOB: [birth year] MRN: [id number]   Date of Service  05/23/2017  HPI/Events of Note  Mild hyperglycemia  eICU Interventions  Started sliding scale.      Intervention Category Intermediate Interventions: Hyperglycemia - evaluation and treatment  [name] 05/23/2017, 8:26 PM

## 2017-05-23 NOTE — Procedures (Signed)
Central Venous Catheter Insertion Procedure Note Cleotis LemaDaniel Brandy [id number] 1981/02/25  Procedure: Insertion of Central Venous Catheter Indications: HD emergent   Procedure Details Consent: Unable to obtain consent because of emergent medical necessity. Time Out: Verified patient identification, verified procedure, site/side was marked, verified correct patient position, special equipment/implants available, medications/allergies/relevent history reviewed, required imaging and test results available.  Performed  Maximum sterile technique was used including antiseptics, cap, gloves, gown, hand hygiene, mask and sheet. Skin prep: Chlorhexidine; local anesthetic administered A antimicrobial bonded/coated triple lumen catheter was placed in the left internal jugular vein using the Seldinger technique.  Evaluation Blood flow good Complications: No apparent complications Patient did tolerate procedure well. Chest X-ray ordered to verify placement.  CXR: pending.  Nelda BucksFEINSTEIN,Daryan J. 05/23/2017, 1:51 PM  US  Mcarthur Rossettianiel J. Tyson AliasFeinstein, MD, FACP Pgr: [phone] [facility]

## 2017-05-23 NOTE — ED Triage Notes (Signed)
Pt to ER after being found unresponsive in a parking lot by bystanders. States when EMS got there he was alert to himself only, on arrival remains a/o x1. Patient snoring at this time, able to follow commands answer questions. Denies consuming ETOH or other substances. Pt significantly diaphoretic. No hx of seizures, HR 140 ST. BP 150/88 on arrival.

## 2017-05-23 NOTE — ED Notes (Signed)
Labs Results given to Nurse [facility].

## 2017-05-23 NOTE — Progress Notes (Signed)
eLink Physician-Brief Progress Note Patient Name: Nicholas LemaDaniel Carson DOB: 1980-09-15 MRN: [id number]   Date of Service  05/23/2017  HPI/Events of Note  K low.  Repeat salicylate level now down to 46.   eICU Interventions  K replaced.  No need for repeat urgent dialysis tonight.  Continue bicarb, vent rate decreased, ordered urine pH.  Continue to monitor repeat ABG and salicylate levels.         Shane Crutchradeep Jospeh Mangel 05/23/2017, 11:31 PM

## 2017-05-23 NOTE — Code Documentation (Signed)
MD Tyson AliasFeinstein pushing 100 mg propofol IV.

## 2017-05-23 NOTE — Progress Notes (Signed)
CRITICAL VALUE ALERT  Critical Value:  Potassium 2.4  Date & Time Noted:  05/23/17 2250  Provider Notified: Shane CrutchPradeep Ramachandran at 2300

## 2017-05-23 NOTE — Code Documentation (Signed)
Intubation successful. VSS. Propofol for sedation. Central line being [facility] at [facility].

## 2017-05-23 NOTE — H&P (Signed)
PULMONARY / CRITICAL CARE MEDICINE   Name: Nicholas Carson MRN: [id number] DOB: 1980-09-04    ADMISSION DATE:  05/23/2017 CONSULTATION DATE:  05/23/2017  REFERRING MD:  Dr. Thomasene Lot (ED)  CHIEF COMPLAINT:  Salicylate intoxication  HISTORY OF PRESENT ILLNESS:   This is a 36 y.o. man with PMHx of uncontrolled DM, schizophrenia, hypertriglyceridemia who presented with altered mental status.  Patient unable to provide history so was obtained from EDP and grandmother at bedside.  Apparently patient was found stumbling across street this morning and fell as witnessed by bystanders.  Brought in via EMS.  On evaluation, patient found to have acute kidney injury, metabolic acidosis, critically elevated salicylate levels, and unable to answer questions or follow commands appropriately.  He was subsequently intubated for airway protection.  Nephrology was consulted for emergent dialysis.  PAST MEDICAL HISTORY :  He  has a past medical history of Diabetes mellitus without complication ([place]).  PAST SURGICAL HISTORY: He  has no past surgical history on file.  No Known Allergies  No current facility-administered medications on file prior to encounter.    Current Outpatient Prescriptions on File Prior to Encounter  Medication Sig  . JANUMET XR 50-1000 MG TB24 Take 1 tablet by mouth 2 (two) times daily.  . risperiDONE (RISPERDAL) 2 MG tablet Take 1-3 mg by mouth 2 (two) times daily. 1 mg qam 3 mg qhs    FAMILY HISTORY:  His has no family status information on file.    SOCIAL HISTORY: He  reports that he has been smoking Cigarettes.  He has been smoking about 1.00 pack per day. He has never used smokeless tobacco. He reports that he does not drink alcohol.  REVIEW OF SYSTEMS:   Unable to obtain    VITAL SIGNS: BP (!) 155/81   Pulse (!) 129   Temp 98.5 F (36.9 C) (Oral)   Resp (!) 30   Wt 260 lb (117.9 kg)   SpO2 96%   HEMODYNAMICS:    VENTILATOR SETTINGS: Vent Mode: PRVC FiO2 (%):   [40 %-100 %] 40 % Set Rate:  [30 bmp] 30 bmp Vt Set:  [550 mL] 550 mL PEEP:  [5 cmH20] 5 cmH20 Plateau Pressure:  [17 cmH20] 17 cmH20  INTAKE / OUTPUT: No intake/output data recorded.  PHYSICAL EXAMINATION: General:  African [place] man, lying in bed, not protecting airway Neuro:  No focal deficits, not following commands HEENT:  NCAT, left sided gaze Cardiovascular:  RRR, no murmurs Lungs:  Snoring breath sounds Abdomen:  Obese, soft, non-distended Musculoskeletal:  Non focal Skin:  Diaphoretic, no rashes  LABS:  Salicylate level 937  BMET  Recent Labs Lab 05/23/17 1011  NA 136  K 5.3*  CL 102  CO2 15*  BUN 14  CREATININE 1.46*  GLUCOSE 255*    Electrolytes  Recent Labs Lab 05/23/17 1011  CALCIUM 8.8*  MG 2.5*    CBC  Recent Labs Lab 05/23/17 1011  WBC 19.5*  HGB 15.6  HCT 46.9  PLT 309    Coag's No results for input(s): APTT, INR in the last 168 hours.  Sepsis Markers  Recent Labs Lab 05/23/17 1026  LATICACIDVEN 2.48*    ABG  Recent Labs Lab 05/23/17 1405  PHART 7.376  PCO2ART 49.3*  PO2ART 465.0*    Liver Enzymes  Recent Labs Lab 05/23/17 1011  AST 29  ALT 22  ALKPHOS 57  BILITOT 0.9  ALBUMIN 3.6    Cardiac Enzymes No results for input(s): TROPONINI, PROBNP in  the last 168 hours.  Glucose  Recent Labs Lab 05/23/17 1009 05/23/17 1441  GLUCAP 272* 166*    Imaging Ct Head Wo Contrast  Result Date: 05/23/2017 CLINICAL DATA:  Found unresponsive. EXAM: CT HEAD WITHOUT CONTRAST TECHNIQUE: Contiguous axial images were obtained from the base of the skull through the vertex without intravenous contrast. COMPARISON:  None. FINDINGS: Brain: No acute intracranial abnormality. Specifically, no hemorrhage, hydrocephalus, mass lesion, acute infarction, or significant intracranial injury. Vascular: No hyperdense vessel or unexpected calcification. Skull: No acute calvarial abnormality. Sinuses/Orbits: Visualized paranasal  sinuses and mastoids clear. Orbital soft tissues unremarkable. Other: None IMPRESSION: No acute intracranial abnormality. Electronically Signed   By: Rolm Baptise M.D.   On: 05/23/2017 10:25   Dg Chest Portable 1 View  Result Date: 05/23/2017 CLINICAL DATA:  Pt found unresponsive by bystanders in a parking lot today. Pt is awake, but snoring and diaphoretic. He does not appear to be able to answer questions with more than a nod. Hx of DM. EXAM: PORTABLE CHEST 1 VIEW COMPARISON:  None. FINDINGS: Normal mediastinum and cardiac silhouette. Normal pulmonary vasculature. No evidence of effusion, infiltrate, or pneumothorax. No acute bony abnormality. IMPRESSION: Normal chest radiograph Electronically Signed   By: Suzy Bouchard M.D.   On: 05/23/2017 10:37   Dg Chest Port 1v Same Day  Result Date: 05/23/2017 CLINICAL DATA:  Intubation. EXAM: PORTABLE CHEST 1 VIEW COMPARISON:  05/23/2017 at 1021 hours FINDINGS: A new endotracheal tube terminates at the clavicular heads, approximately 3.4 cm above the carina. A left jugular catheter terminates over the mid to lower SVC. An enteric tube terminates over the left upper quadrant, likely in the proximal stomach. The side hole of the tube projects over the distal esophagus. Lung volumes are diminished with central pulmonary vascular congestion. No lobar consolidation, overt edema, sizable pleural effusion, or pneumothorax is identified. IMPRESSION: 1. Support devices as above. 2. Low lung volumes with central pulmonary vascular congestion. Electronically Signed   By: Logan Bores M.D.   On: 05/23/2017 13:45    STUDIES:    CULTURES:   ANTIBIOTICS:   SIGNIFICANT EVENTS: 10/17 >> Admitted to ICU, HD initiated  LINES/TUBES: 10/17 >> ETT, NG/OG 10/17 >>  PIV x 2 10/17 >> Left IJ 10/17 >> Left radial arterial line 10/17 >> Foley  DISCUSSION: 36 yo man with PMHx significant for diabetes and schizophrenia admitted for salicylate poisoning.  Intubated on  admission with plans for emergent HD.  ASSESSMENT / PLAN:  PULMONARY A: Acute respiratory failure from inability to adequately protect airway due to AMS 2/2 salicylate poisoning P:   Admit to ICU Vent support, respiratory rate of 30 Due to primary respiratory failure, ensure appropriately high minute ventilation VAP prevention ABG Q4H,  maintain appropriate alkalemia CXR in AM  CARDIOVASCULAR A:  Sinus tachycardia Hypertension P:  Follow   RENAL A:   Anion gap metabolic acidosis Hyperkalemia Acute kidney injury P:   Nephrology following, plan emergent HD Check salicylate level 2 hours post HD session Alkalinize serum and urine, sodium bicarb 16mq at 250cc/hr BMET Q8H Strict I/Os Check UDS  GASTROINTESTINAL A:   No acute issues P:   PPI IV Daily CMET to assess for liver injury Monitor need for tube feeds  HEMATOLOGIC A:   DVT PPx P:  Heparin SQ  INFECTIOUS A:   Leukocytosis P:   Likely related to stress, no other signs of infection Follow CBC  ENDOCRINE A:   DM, last A1c 9.0 in June 2018 Hyperglycemia  P:   CBG Q4H ASA intoxication may decrease cerebral glucose concentrations despite normal serum glucose so will monitor CBG prior to starting sliding scale insulin  NEUROLOGIC A:   History of Schizophrenia P:   RASS goal: 0 to -1    FAMILY  - Updates: family updated in the ED  - Inter-disciplinary family meet or Palliative Care meeting due by:  10/24    [facility]ulmonary and [place] Pager: [phone]  05/23/2017, 2:51 PM  STAFF NOTE: I, Merrie Roof, MD FACP have personally reviewed patient's available data, including medical history, events of note, physical examination and test results as part of my evaluation. I have discussed with resident/NP and other care providers such as pharmacist, RN and RRT. In addition, I personally evaluated patient and elicited key findings of: escalating worsening  neurostatus, now not following any commands, eggaggerated deep respirations, JVD down, obese neck, CTA slight coarse, poor to NO cough, abdo soft, no rash or deformity on exam, no rash, neck supple, pcxr I reviewed shows NO defined infiltrate, salicylate almost 827, I took extensive history first from grandmother then mother, he has been depressed and uses aspirin for headaches and was found confused, appearing as severe life threatening salicylate OD, he requires emergent intubation and HD, have spoken to renal, placing HD catheter stat, ETT prior placed , he is in resp failure and zero airway protections kills now, assess tox screen and osmolality, family will also assess home for other bottles, increase bicarb drip to alkalinaize urine for exretion, follow sal level 2 hours post HD, and likley with this degree of OD, will consider with renal repeat HD, especially if level still greater 50 and symptoms so severe, I am concerned for risk pulm edema and shock, appears ot have ARF associatd and will need to watch urine output andchem frequently, I do not see evidence of aspiration at this time, add sub q hep, esnure d5 in fluids with risk hypoglycemia, he is on rispirdal but I do not see any sig fever or rigidity at this stage, assess qtc, get cpk, he requirs prop and fent for now for vent sychrony, rate to 30 on vent with physciologic needs of resp alk for now, abg stat and q6h, hold feeds till am, he will need psych assessment if he survives this insult, I met with grandmother and updatd her then updated mom and enture family extensive The patient is critically ill with multiple organ systems failure and requires high complexity decision making for assessment and support, frequent evaluation and titration of therapies, application of advanced monitoring technologies and extensive interpretation of multiple databases.   Critical Care Time devoted to patient care services described in this note is120 Minutes.  This time reflects time of care of this signee: Merrie Roof, MD FACP. This critical care time does not reflect procedure time, or teaching time or supervisory time of PA/NP/Med student/Med Resident etc but could involve care discussion time. Rest per NP/medical resident whose note is outlined above and that I agree with   Lavon Paganini. Titus Mould, MD, [place] Pgr: [facility] 05/23/2017 5:17 PM

## 2017-05-23 NOTE — Consult Note (Signed)
Reason for Consult: salicylate toxicity. Referring Physician: Dr. Tyson Alias  HPI:   Nicholas Carson is an 36 y.o. male. With PMHx significant for schizophrenia,DM and hypertriglyceridemia who was presented to ED today with altered mental status.  History was obtained with chart review as patient was intubated.  Apparently patient was found stumbling on the street this morning, later had a fall which was witnessed by bystanders.He was brought to ED via EMS. According to police who accompanied him, he has multiple address changes, had multiple IVC for some psychiatric issues in the past and multiple calls for in decent exposure.  On evaluation patient was altered with acute kidney injury, metabolic acidosis. He was unable to answer any questions or follow commands. He was then intubated for airway protection. On lab work found to have salicylate levels of 116.7. Nephrology was consulted for emergent diaysis.   Trend in Creatinine: Creatinine, Ser  Date/Time Value Ref Range Status  05/23/2017 10:11 AM 1.46 (H) 0.61 - 1.24 mg/dL Final  96/11/5407 81:19 PM 0.98 0.61 - 1.24 mg/dL Final    PMH:   Past Medical History:  Diagnosis Date  . Diabetes mellitus without complication (H[facility])     PSH:  History reviewed. No pertinent surgical history.  Allergies: No Known Allergies  Medications:   Prior to Admission medications   Medication Sig Start Date End Date Taking? Authorizing Provider  JANUMET XR 50-1000 MG TB24 Take 1 tablet by mouth 2 (two) times daily. 01/19/16   [provider]  risperiDONE (RISPERDAL) 2 MG tablet Take 1-3 mg by mouth 2 (two) times daily. 1 mg qam 3 mg qhs 01/11/16   [provider]    Inpatient medications: . fentaNYL (SUBLIMAZE) injection  200 mcg Intravenous Once  . fentaNYL (SUBLIMAZE) injection  50 mcg Intravenous Once  . heparin  5,000 Units Subcutaneous Q8H  . LORazepam      . midazolam  4 mg Intravenous Once  . pantoprazole (PROTONIX) IV  40 mg  Intravenous QHS    Discontinued Meds:  There are no discontinued medications.  Social History:  reports that he has been smoking Cigarettes.  He has been smoking about 1.00 pack per day. He has never used smokeless tobacco. He reports that he does not drink alcohol. His drug history is not on file.  Family History:  History reviewed. No pertinent family history.  Review of systems not obtained due to patient factors. Weight change:   Intake/Output Summary (Last 24 hours) at 05/23/17 1522 Last data filed at 05/23/17 1414  Gross per 24 hour  Intake                0 ml  Output              800 ml  Net             -800 ml   BP (!) 155/81   Pulse (!) 129   Temp 99.6 F (37.6 C) (Oral)   Resp (!) 30   Ht 6\' 1"  (1.854 m)   Wt 269 lb 6.4 oz (122.2 kg)   SpO2 96%   BMI 35.54 kg/m  Vitals:   05/23/17 1300 05/23/17 1315 05/23/17 1433 05/23/17 1451  BP: (!) 156/84 (!) 155/81    Pulse: (!) 131 (!) 132 (!) 129   Resp:   (!) 30   Temp:    99.6 F (37.6 C)  TempSrc:    Oral  SpO2: 100% 96%    Weight:  269 lb 6.4 oz (122.2 kg)  Height:    6\' 1"  (1.854 m)      General: Vital signs reviewed.  Patient is well-developed and well-nourished,obese, was unresponsive and intubated. Cardiovascular: RRR, S1 normal, S2 normal, no murmurs, gallops, or rubs. Pulmonary/Chest: Coarse breath sounds. Abdominal: Soft, non-tender, non-distended, BS +. Extremities: No lower extremity edema bilaterally,  pulses symmetric and intact bilaterally. No cyanosis or clubbing. Skin: Warm, dry and intact. No rashes or erythema.  Labs: Basic Metabolic Panel:  Recent Labs Lab 05/23/17 1011  NA 136  K 5.3*  CL 102  CO2 15*  GLUCOSE 255*  BUN 14  CREATININE 1.46*  ALBUMIN 3.6  CALCIUM 8.8*   Liver Function Tests:  Recent Labs Lab 05/23/17 1011  AST 29  ALT 22  ALKPHOS 57  BILITOT 0.9  PROT 7.9  ALBUMIN 3.6   No results for input(s): LIPASE, AMYLASE in the last 168 hours.  Recent Labs Lab  05/23/17 1033  AMMONIA 49*   CBC:  Recent Labs Lab 05/23/17 1011  WBC 19.5*  NEUTROABS 18.2*  HGB 15.6  HCT 46.9  MCV 82.9  PLT 309   PT/INR: @LABRCNTIP (inr:5) Cardiac Enzymes: )No results for input(s): CKTOTAL, CKMB, CKMBINDEX, TROPONINI in the last 168 hours. CBG:  Recent Labs Lab 05/23/17 1009 05/23/17 1441  GLUCAP 272* 166*    Iron Studies: No results for input(s): IRON, TIBC, TRANSFERRIN, FERRITIN in the last 168 hours.  Xrays/Other Studies: Ct Head Wo Contrast  Result Date: 05/23/2017 CLINICAL DATA:  Found unresponsive. EXAM: CT HEAD WITHOUT CONTRAST TECHNIQUE: Contiguous axial images were obtained from the base of the skull through the vertex without intravenous contrast. COMPARISON:  None. FINDINGS: Brain: No acute intracranial abnormality. Specifically, no hemorrhage, hydrocephalus, mass lesion, acute infarction, or significant intracranial injury. Vascular: No hyperdense vessel or unexpected calcification. Skull: No acute calvarial abnormality. Sinuses/Orbits: Visualized paranasal sinuses and mastoids clear. Orbital soft tissues unremarkable. Other: None IMPRESSION: No acute intracranial abnormality. Electronically Signed   By: Charlett Nose M.D.   On: 05/23/2017 10:25   Dg Chest Portable 1 View  Result Date: 05/23/2017 CLINICAL DATA:  Pt found unresponsive by bystanders in a parking lot today. Pt is awake, but snoring and diaphoretic. He does not appear to be able to answer questions with more than a nod. Hx of DM. EXAM: PORTABLE CHEST 1 VIEW COMPARISON:  None. FINDINGS: Normal mediastinum and cardiac silhouette. Normal pulmonary vasculature. No evidence of effusion, infiltrate, or pneumothorax. No acute bony abnormality. IMPRESSION: Normal chest radiograph Electronically Signed   By: Genevive Bi M.D.   On: 05/23/2017 10:37   Dg Chest Port 1v Same Day  Result Date: 05/23/2017 CLINICAL DATA:  Intubation. EXAM: PORTABLE CHEST 1 VIEW COMPARISON:  05/23/2017 at  1021 hours FINDINGS: A new endotracheal tube terminates at the clavicular heads, approximately 3.4 cm above the carina. A left jugular catheter terminates over the mid to lower SVC. An enteric tube terminates over the left upper quadrant, likely in the proximal stomach. The side hole of the tube projects over the distal esophagus. Lung volumes are diminished with central pulmonary vascular congestion. No lobar consolidation, overt edema, sizable pleural effusion, or pneumothorax is identified. IMPRESSION: 1. Support devices as above. 2. Low lung volumes with central pulmonary vascular congestion. Electronically Signed   By: Sebastian Ache M.D.   On: 05/23/2017 13:45     Assessment/Plan: 1. Salicylate toxicity. Most likely the cause of his AKI, high anion gap metabolic acidosis and hyperkalemia,With  levels  of 116.7 and altered mental status he will need emergent dialysis.      - Repeat salicylate levels 2 hours after dialysis.      -Continue monitoring ABGs and electrolytes  and                replete as needed.  2. DM. Per primary team.  3. Schizophrenia. According to chart review patient was on risperidone. Can continue once stabilized.  4. VDRF- intubated for airway protection  5. Metabolic acidosis/lactic acidosis- as above on emergent HD and also receiving IV isotonic bicarb.  I have seen and examined this patient and agree with plan and assessment in the above note with renal recommendations/intervention highlighted.  Plan for emergent HD for salicylate clearance.  Due to markedly elevated levels, will likely require multiple sessions of dialysis. Jomarie LongsJoseph A Sheina Mcleish,MD 05/23/2017 3:53 PM     Sumayya Amin 05/23/2017, 3:22 PM

## 2017-05-23 NOTE — ED Notes (Signed)
Pt clothing removed and placed in gown. Incontinence of bowel and bladder noted.

## 2017-05-24 ENCOUNTER — Inpatient Hospital Stay (HOSPITAL_COMMUNITY): Payer: Medicare Other

## 2017-05-24 DIAGNOSIS — T39091A Poisoning by salicylates, accidental (unintentional), initial encounter: Principal | ICD-10-CM

## 2017-05-24 LAB — COMPREHENSIVE METABOLIC PANEL
ALBUMIN: 2.8 g/dL — AB (ref 3.5–5.0)
ALK PHOS: 39 U/L (ref 38–126)
ALK PHOS: 41 U/L (ref 38–126)
ALT: 22 U/L (ref 17–63)
ALT: 22 U/L (ref 17–63)
ANION GAP: 10 (ref 5–15)
ANION GAP: 12 (ref 5–15)
AST: 49 U/L — ABNORMAL HIGH (ref 15–41)
AST: 64 U/L — ABNORMAL HIGH (ref 15–41)
Albumin: 2.7 g/dL — ABNORMAL LOW (ref 3.5–5.0)
BILIRUBIN TOTAL: 0.3 mg/dL (ref 0.3–1.2)
BILIRUBIN TOTAL: 0.6 mg/dL (ref 0.3–1.2)
BUN: 12 mg/dL (ref 6–20)
BUN: 13 mg/dL (ref 6–20)
CALCIUM: 7 mg/dL — AB (ref 8.9–10.3)
CALCIUM: 6.9 mg/dL — AB (ref 8.9–10.3)
CO2: 32 mmol/L (ref 22–32)
CO2: 31 mmol/L (ref 22–32)
CREATININE: 1.45 mg/dL — AB (ref 0.61–1.24)
Chloride: 98 mmol/L — ABNORMAL LOW (ref 101–111)
Chloride: 96 mmol/L — ABNORMAL LOW (ref 101–111)
Creatinine, Ser: 1.46 mg/dL — ABNORMAL HIGH (ref 0.61–1.24)
GFR calc Af Amer: >60 mL/min (ref >60)
GFR calc non Af Amer: >60 mL/min (ref >60)
GFR, EST NON AFRICAN AMERICAN: 60 mL/min — AB (ref >60)
Glucose, Bld: 139 mg/dL — ABNORMAL HIGH (ref 65–99)
Glucose, Bld: 126 mg/dL — ABNORMAL HIGH (ref 65–99)
Potassium: 2.3 mmol/L — CL (ref 3.5–5.1)
Potassium: 2.2 mmol/L — CL (ref 3.5–5.1)
Sodium: 140 mmol/L (ref 135–145)
Sodium: 139 mmol/L (ref 135–145)
TOTAL PROTEIN: 5.8 g/dL — AB (ref 6.5–8.1)
TOTAL PROTEIN: 5.8 g/dL — AB (ref 6.5–8.1)

## 2017-05-24 LAB — BASIC METABOLIC PANEL
Anion gap: 8 (ref 5–15)
BUN: 10 mg/dL (ref 6–20)
CO2: 30 mmol/L (ref 22–32)
CREATININE: 1.15 mg/dL (ref 0.61–1.24)
Calcium: 6.8 mg/dL — ABNORMAL LOW (ref 8.9–10.3)
Chloride: 101 mmol/L (ref 101–111)
GFR calc Af Amer: >60 mL/min (ref >60)
Glucose, Bld: 109 mg/dL — ABNORMAL HIGH (ref 65–99)
Potassium: 2.4 mmol/L — CL (ref 3.5–5.1)
SODIUM: 139 mmol/L (ref 135–145)

## 2017-05-24 LAB — BLOOD GAS, ARTERIAL
ACID-BASE EXCESS: 7.4 mmol/L — AB (ref 0.0–2.0)
ACID-BASE EXCESS: 10.4 mmol/L — AB (ref 0.0–2.0)
ACID-BASE EXCESS: 11.2 mmol/L — AB (ref 0.0–2.0)
BICARBONATE: 33.4 mmol/L — AB (ref 20.0–28.0)
BICARBONATE: 34.5 mmol/L — AB (ref 20.0–28.0)
Bicarbonate: 30.5 mmol/L — ABNORMAL HIGH (ref 20.0–28.0)
DRAWN BY: 414221
DRAWN BY: 414221
Drawn by: 414221
FIO2: 40
FIO2: 40
FIO2: 40
LHR: 30 {breaths}/min
LHR: 30 {breaths}/min
MECHVT: 550 mL
MECHVT: 550 mL
O2 SAT: 98.6 %
O2 Saturation: 98.1 %
O2 Saturation: 97.4 %
PATIENT TEMPERATURE: 99.9
PEEP/CPAP: 5 cmH2O
PEEP/CPAP: 5 cmH2O
PEEP/CPAP: 5 cmH2O
PH ART: 7.531 — AB (ref 7.350–7.450)
PH ART: 7.552 — AB (ref 7.350–7.450)
PO2 ART: 135 mmHg — AB (ref 83.0–108.0)
Patient temperature: 99.6
Patient temperature: 99.6
RATE: 30 resp/min
VT: 550 mL
pCO2 arterial: 36.8 mmHg (ref 32.0–48.0)
pCO2 arterial: 36.9 mmHg (ref 32.0–48.0)
pCO2 arterial: 39.6 mmHg (ref 32.0–48.0)
pH, Arterial: 7.567 — ABNORMAL HIGH (ref 7.350–7.450)
pO2, Arterial: 111 mmHg — ABNORMAL HIGH (ref 83.0–108.0)
pO2, Arterial: 97.4 mmHg (ref 83.0–108.0)

## 2017-05-24 LAB — POCT I-STAT 3, ART BLOOD GAS (G3+)
Acid-Base Excess: 16 mmol/L — ABNORMAL HIGH (ref 0.0–2.0)
Bicarbonate: 38.4 mmol/L — ABNORMAL HIGH (ref 20.0–28.0)
O2 Saturation: 98 %
PCO2 ART: 39.2 mmHg (ref 32.0–48.0)
PH ART: 7.602 — AB (ref 7.350–7.450)
Patient temperature: 37.9
TCO2: 39 mmol/L — ABNORMAL HIGH (ref 22–32)
pO2, Arterial: 99 mmHg (ref 83.0–108.0)

## 2017-05-24 LAB — CBC
HCT: 37.3 % — ABNORMAL LOW (ref 39.0–52.0)
HEMOGLOBIN: 12 g/dL — AB (ref 13.0–17.0)
MCH: 26.7 pg (ref 26.0–34.0)
MCHC: 32.2 g/dL (ref 30.0–36.0)
MCV: 83.1 fL (ref 78.0–100.0)
PLATELETS: 212 10*3/uL (ref 150–400)
RBC: 4.49 MIL/uL (ref 4.22–5.81)
RDW: 15.8 % — ABNORMAL HIGH (ref 11.5–15.5)
WBC: 11.2 10*3/uL — ABNORMAL HIGH (ref 4.0–10.5)

## 2017-05-24 LAB — SALICYLATE LEVEL
Salicylate Lvl: 39.4 mg/dL (ref 2.8–30.0)
Salicylate Lvl: 13.6 mg/dL (ref 2.8–30.0)

## 2017-05-24 LAB — GLUCOSE, CAPILLARY
GLUCOSE-CAPILLARY: 110 mg/dL — AB (ref 65–99)
GLUCOSE-CAPILLARY: 86 mg/dL (ref 65–99)
GLUCOSE-CAPILLARY: 99 mg/dL (ref 65–99)
Glucose-Capillary: 133 mg/dL — ABNORMAL HIGH (ref 65–99)
Glucose-Capillary: 112 mg/dL — ABNORMAL HIGH (ref 65–99)
Glucose-Capillary: 98 mg/dL (ref 65–99)

## 2017-05-24 LAB — HEPATITIS B SURFACE ANTIBODY,QUALITATIVE: Hep B S Ab: NONREACTIVE

## 2017-05-24 LAB — HEPATITIS B SURFACE ANTIGEN: Hepatitis B Surface Ag: NEGATIVE

## 2017-05-24 LAB — MAGNESIUM: Magnesium: 2 mg/dL (ref 1.7–2.4)

## 2017-05-24 LAB — HIV ANTIBODY (ROUTINE TESTING W REFLEX): HIV Screen 4th Generation wRfx: NONREACTIVE

## 2017-05-24 LAB — PHOSPHORUS: PHOSPHORUS: 1.5 mg/dL — AB (ref 2.5–4.6)

## 2017-05-24 LAB — HEPATITIS B CORE ANTIBODY, TOTAL: HEP B C TOTAL AB: NEGATIVE

## 2017-05-24 MED ORDER — INSULIN ASPART 100 UNIT/ML ~~LOC~~ SOLN: 4.0000 [IU] | Freq: Three times a day (TID) | SUBCUTANEOUS | Status: DC

## 2017-05-24 MED ORDER — POTASSIUM CHLORIDE 10 MEQ/100ML IV SOLN: 10.0000 meq | INTRAVENOUS | Status: DC

## 2017-05-24 MED ORDER — SODIUM CHLORIDE 0.9 % IV SOLN
INTRAVENOUS | Status: DC
  Administered 2017-05-24: 22:00:00 via INTRAVENOUS
  Administered 2017-05-24: 250 mL/h via INTRAVENOUS
  Administered 2017-05-24 (×2): via INTRAVENOUS

## 2017-05-24 MED ORDER — INSULIN ASPART 100 UNIT/ML ~~LOC~~ SOLN
0.0000 [IU] | Freq: Three times a day (TID) | SUBCUTANEOUS | Status: DC
  Administered 2017-05-26: 1 [IU] via SUBCUTANEOUS

## 2017-05-24 MED ORDER — POTASSIUM CHLORIDE 10 MEQ/50ML IV SOLN
10.0000 meq | INTRAVENOUS | Status: AC
  Administered 2017-05-24 (×5): 10 meq via INTRAVENOUS
  Filled 2017-05-24 (×7): qty 50

## 2017-05-24 MED ORDER — POTASSIUM CHLORIDE 10 MEQ/50ML IV SOLN
10.0000 meq | INTRAVENOUS | Status: AC
  Administered 2017-05-24 (×6): 10 meq via INTRAVENOUS
  Filled 2017-05-24 (×6): qty 50

## 2017-05-24 MED ORDER — ORAL CARE MOUTH RINSE
15.0000 mL | Freq: Two times a day (BID) | OROMUCOSAL | Status: DC
Start: 2017-05-24 — End: 2017-05-26
  Administered 2017-05-24 – 2017-05-26 (×3): 15 mL via OROMUCOSAL

## 2017-05-24 MED ORDER — RISPERIDONE 2 MG PO TABS
2.0000 mg | ORAL_TABLET | Freq: Two times a day (BID) | ORAL | Status: DC
  Administered 2017-05-24 – 2017-05-26 (×5): 2 mg via ORAL
  Filled 2017-05-24 (×7): qty 1

## 2017-05-24 NOTE — Progress Notes (Signed)
S:Nicholas Carson is an 36 y.o. male. With PMHx significant for schizophrenia,DM and hypertriglyceridemia presented with salicylate toxicity. Pt. Remained intubated when seen this morning, opening eyes and following simple commands.Admit taking a lot of aspirin yesterday, unable to tell the amount,denies it as a suicidal attempt? According to family he was having pain in his eye after his small bowel surgery on his  eyelid.  O:BP 117/67   Pulse (!) 107   Temp 100.2 F (37.9 C)   Resp (!) 26   Ht '6\' 1"'$  (1.854 m)   Wt 272 lb 7.8 oz (123.6 kg)   SpO2 94%   BMI 35.95 kg/m   Intake/Output Summary (Last 24 hours) at 05/24/17 0933 Last data filed at 05/24/17 0915  Gross per 24 hour  Intake          5844.84 ml  Output             1820 ml  Net          4024.84 ml   Intake/Output: I/O last 3 completed shifts: In: 5212.4 [I.V.:5012.4; IV Piggyback:200] Out: 0258 [NIDPO:2423]  Intake/Output this shift:  Total I/O In: 632.4 [I.V.:582.4; IV Piggyback:50] Out: 75 [Urine:75] Weight change:  Gen: Well developed, obese gentleman, intubated, following simple commands. CVS: RRR, No M/R/G. Resp: Clear bilaterally. Abd: Soft, non tender, non distended, BS +. Ext: No edema, no cyanosis, pulses intact and symmetrical.   Recent Labs Lab 05/23/17 1011 05/23/17 1500 05/23/17 2215 05/24/17 0400 05/24/17 0600  NA 136 144 140 140 139  K 5.3* 4.3 2.4* 2.3* 2.2*  CL 102 105 100* 98* 96*  CO2 15* 25 29 32 31  GLUCOSE 255* 191* 133* 139* 126*  BUN '14 14 12 12 13  '$ CREATININE 1.46* 1.49* 1.46* 1.46* 1.45*  ALBUMIN 3.6 3.1*  --  2.7* 2.8*  CALCIUM 8.8* 7.5* 7.4* 7.0* 6.9*  PHOS  --   --   --   --  1.5*  AST 29 16  --  49* 64*  ALT 22 20  --  22 22   Liver Function Tests:  Recent Labs Lab 05/23/17 1500 05/24/17 0400 05/24/17 0600  AST 16 49* 64*  ALT '20 22 22  '$ ALKPHOS 47 39 41  BILITOT 0.5 0.3 0.6  PROT 6.8 5.8* 5.8*  ALBUMIN 3.1* 2.7* 2.8*   No results for input(s): LIPASE, AMYLASE in  the last 168 hours.  Recent Labs Lab 05/23/17 1033  AMMONIA 49*   CBC:  Recent Labs Lab 05/23/17 1011 05/24/17 0400  WBC 19.5* 11.2*  NEUTROABS 18.2*  --   HGB 15.6 12.0*  HCT 46.9 37.3*  MCV 82.9 83.1  PLT 309 212   Cardiac Enzymes:  Recent Labs Lab 05/23/17 1750  CKTOTAL 375   CBG:  Recent Labs Lab 05/23/17 1009 05/23/17 1441 05/23/17 1948 05/23/17 2323 05/24/17 0335  GLUCAP 272* 166* 134* 135* 133*    Iron Studies: No results for input(s): IRON, TIBC, TRANSFERRIN, FERRITIN in the last 72 hours. Studies/Results: Ct Head Wo Contrast  Result Date: 05/23/2017 CLINICAL DATA:  Found unresponsive. EXAM: CT HEAD WITHOUT CONTRAST TECHNIQUE: Contiguous axial images were obtained from the base of the skull through the vertex without intravenous contrast. COMPARISON:  None. FINDINGS: Brain: No acute intracranial abnormality. Specifically, no hemorrhage, hydrocephalus, mass lesion, acute infarction, or significant intracranial injury. Vascular: No hyperdense vessel or unexpected calcification. Skull: No acute calvarial abnormality. Sinuses/Orbits: Visualized paranasal sinuses and mastoids clear. Orbital soft tissues unremarkable. Other: None IMPRESSION: No acute  intracranial abnormality. Electronically Signed   By: Rolm Baptise M.D.   On: 05/23/2017 10:25   Dg Chest Port 1 View  Result Date: 05/24/2017 CLINICAL DATA:  Intubation EXAM: PORTABLE CHEST 1 VIEW COMPARISON:  05/23/2017 FINDINGS: Endotracheal tube in good position. Gastric tube enters the stomach. Left jugular central venous catheter tip in the SVC is unchanged. Improved aeration of the lungs with improved lung volume. Mild bibasilar airspace disease improved. No effusion IMPRESSION: Endotracheal tube in good position. Improved lung volume with decrease in bibasilar airspace disease. Electronically Signed   By: Franchot Gallo M.D.   On: 05/24/2017 06:58   Dg Chest Portable 1 View  Result Date: 05/23/2017 CLINICAL  DATA:  Pt found unresponsive by bystanders in a parking lot today. Pt is awake, but snoring and diaphoretic. He does not appear to be able to answer questions with more than a nod. Hx of DM. EXAM: PORTABLE CHEST 1 VIEW COMPARISON:  None. FINDINGS: Normal mediastinum and cardiac silhouette. Normal pulmonary vasculature. No evidence of effusion, infiltrate, or pneumothorax. No acute bony abnormality. IMPRESSION: Normal chest radiograph Electronically Signed   By: Suzy Bouchard M.D.   On: 05/23/2017 10:37   Dg Chest Port 1v Same Day  Result Date: 05/23/2017 CLINICAL DATA:  Intubation. EXAM: PORTABLE CHEST 1 VIEW COMPARISON:  05/23/2017 at 1021 hours FINDINGS: A new endotracheal tube terminates at the clavicular heads, approximately 3.4 cm above the carina. A left jugular catheter terminates over the mid to lower SVC. An enteric tube terminates over the left upper quadrant, likely in the proximal stomach. The side hole of the tube projects over the distal esophagus. Lung volumes are diminished with central pulmonary vascular congestion. No lobar consolidation, overt edema, sizable pleural effusion, or pneumothorax is identified. IMPRESSION: 1. Support devices as above. 2. Low lung volumes with central pulmonary vascular congestion. Electronically Signed   By: Logan Bores M.D.   On: 05/23/2017 13:45   . chlorhexidine gluconate (MEDLINE KIT)  15 mL Mouth Rinse BID  . Chlorhexidine Gluconate Cloth  6 each Topical Daily  . fentaNYL (SUBLIMAZE) injection  200 mcg Intravenous Once  . heparin  5,000 Units Subcutaneous Q8H  . mouth rinse  15 mL Mouth Rinse QID  . midazolam  4 mg Intravenous Once  . pantoprazole (PROTONIX) IV  40 mg Intravenous QHS  . sodium chloride flush  10-40 mL Intracatheter Q12H    BMET    Component Value Date/Time   NA 139 05/24/2017 0600   K 2.2 (LL) 05/24/2017 0600   CL 96 (L) 05/24/2017 0600   CO2 31 05/24/2017 0600   GLUCOSE 126 (H) 05/24/2017 0600   BUN 13 05/24/2017 0600    CREATININE 1.45 (H) 05/24/2017 0600   CALCIUM 6.9 (L) 05/24/2017 0600   GFRNONAA >60 05/24/2017 0600   GFRAA >60 05/24/2017 0600   CBC    Component Value Date/Time   WBC 11.2 (H) 05/24/2017 0400   RBC 4.49 05/24/2017 0400   HGB 12.0 (L) 05/24/2017 0400   HCT 37.3 (L) 05/24/2017 0400   PLT 212 05/24/2017 0400   MCV 83.1 05/24/2017 0400   MCH 26.7 05/24/2017 0400   MCHC 32.2 05/24/2017 0400   RDW 15.8 (H) 05/24/2017 0400   LYMPHSABS 0.9 05/23/2017 1011   MONOABS 0.3 05/23/2017 1011   EOSABS 0.0 05/23/2017 1011   BASOSABS 0.0 05/23/2017 1011     Assessment/Plan:  1. Salicylate toxicity- admits to taking a lot of aspirin for left eye pain, no SI.  Levels have markedly improved after one session of HD.  Ok to stop bicarb drip and change to NS 2. AKI. Most likely due to salicylate toxicity. Salicylate levels improving,this morning it was 39.4. Acidosis resolved,we will keep him at mild alkalosis level,0 salicylate levels completely normalized.Renal function remain stable at 1.46, baseline at 1.0 in 03/18.      - no need for more dialysis.     - Discontinue bicarbonate infusion.     -Start him on normal saline to 250 mL per hour.     - Repeat salicylate levels this afternoon.     -Keep monitoring renal functions.  3. DM. Per primary team.  4. Schizophrenia. According to chart review patient was on risperidone. Can continue once stabilized.  I have seen and examined this patient and agree with plan and assessment in the above note with renal recommendations/intervention highlighted.  Markedly improved with one session of HD and now with salicylate level of 39.  Doubt that he will require any further HD.   Governor Rooks Darwin Guastella,MD 05/24/2017 12:10 PM   Donetta Potts, MD [facility] [phone]

## 2017-05-24 NOTE — Procedures (Signed)
Extubation Procedure Note  Patient Details:   Name: Cleotis LemaDaniel Ghanem DOB: October 20, 1980 MRN: [id number]   Airway Documentation:  Airway 8 mm (Active)  Secured at (cm) 26 cm 05/24/2017 12:05 PM  Measured From Lips 05/24/2017 12:05 PM  Secured Location Center 05/24/2017 12:05 PM  Secured By [employer] Tube Holder 05/24/2017 12:05 PM  Tube Holder Repositioned Yes 05/24/2017 12:05 PM  Cuff Pressure (cm H2O) 30 cm H2O 05/24/2017  7:55 AM  Site Condition Dry 05/24/2017 12:05 PM    Evaluation  O2 sats: stable throughout Complications: No apparent complications Patient did tolerate procedure well. Bilateral Breath Sounds: Clear, Diminished   Yes   Patient extubated per order to 4L [place] with no apparent complications. Cuff leak was noted prior to extubation. Patient is able to speak and is alert and oriented to time and place. Vitals are stable and sats are 95%. RT will continue to monitor.   Jaqwan Wieber Lajuana RippleM Janeah Kovacich 05/24/2017, 3:12 PM

## 2017-05-24 NOTE — Progress Notes (Signed)
PULMONARY / CRITICAL CARE MEDICINE   Name: Nicholas Carson MRN: [id number] DOB: 12-18-1980    ADMISSION DATE:  05/23/2017 CONSULTATION DATE:  05/23/2017  REFERRING MD:  Dr. Corlis LeakMackuen (ED)  CHIEF COMPLAINT:  Salicylate intoxication  HISTORY OF PRESENT ILLNESS:   This is a 36 y.o. man with PMHx of uncontrolled DM, schizophrenia, hypertriglyceridemia who presented with altered mental status.  Patient unable to provide history so was obtained from EDP and grandmother at bedside.  Apparently patient was found stumbling across street this morning and fell as witnessed by bystanders.  Brought in via EMS.  On evaluation, patient found to have acute kidney injury, metabolic acidosis, critically elevated salicylate levels, and unable to answer questions or follow commands appropriately.  He was subsequently intubated for airway protection.  Nephrology was consulted for emergent dialysis.  REVIEW OF SYSTEMS:   Unable to obtain.   VITAL SIGNS: BP 102/64   Pulse (!) 101   Temp 100.2 F (37.9 C)   Resp (!) 30   Ht 6\' 1"  (1.854 m)   Wt 272 lb 7.8 oz (123.6 kg)   SpO2 94%   BMI 35.95 kg/m   HEMODYNAMICS: CVP:  [11 mmHg-12 mmHg] 12 mmHg  VENTILATOR SETTINGS: Vent Mode: PRVC FiO2 (%):  [40 %-100 %] 40 % Set Rate:  [30 bmp] 30 bmp Vt Set:  [550 mL] 550 mL PEEP:  [5 cmH20] 5 cmH20 Plateau Pressure:  [13 cmH20-19 cmH20] 19 cmH20  INTAKE / OUTPUT: I/O last 3 completed shifts: In: 5212.4 [I.V.:5012.4; IV Piggyback:200] Out: 1745 [Urine:1745]   UOP: 3.4L   PHYSICAL EXAMINATION: General:  36 yo obese [place] male, awake, ETT in place  Neuro:  No focal deficits, awake and able to follow commands  HEENT:  NCAT, PERRL, MMM  Cardiovascular:  RRR, no murmurs Lungs:  CTAB, non-laboured  Abdomen:  Obese, soft, non-distended, +bs  Musculoskeletal:  No edema  Skin:  Warm, dry, no rashes  LABS: Salicylate level 116>39.4 Initial ABG: pH 7.5, pCO2 36.8, bicarb 30.5  Repeat ABG: pH 7.6,  pCO2 39.2, bicarb 38 K+ 4.3> 2.2  Mag 2.0 Phos 1.5   BMET  Recent Labs Lab 05/23/17 2215 05/24/17 0400 05/24/17 0600  NA 140 140 139  K 2.4* 2.3* 2.2*  CL 100* 98* 96*  CO2 29 32 31  BUN 12 12 13   CREATININE 1.46* 1.46* 1.45*  GLUCOSE 133* 139* 126*    Electrolytes  Recent Labs Lab 05/23/17 1011  05/23/17 2215 05/24/17 0400 05/24/17 0600  CALCIUM 8.8*  < > 7.4* 7.0* 6.9*  MG 2.5*  --   --   --  2.0  PHOS  --   --   --   --  1.5*  < > = values in this interval not displayed.  CBC  Recent Labs Lab 05/23/17 1011 05/24/17 0400  WBC 19.5* 11.2*  HGB 15.6 12.0*  HCT 46.9 37.3*  PLT 309 212    Coag's  Recent Labs Lab 05/23/17 1750  INR 1.89    Sepsis Markers  Recent Labs Lab 05/23/17 1026  LATICACIDVEN 2.48*    ABG  Recent Labs Lab 05/24/17 0240 05/24/17 0545 05/24/17 1015  PHART 7.567* 7.552* 7.602*  PCO2ART 36.9 39.6 39.2  PO2ART 111* 97.4 99.0    Liver Enzymes  Recent Labs Lab 05/23/17 1500 05/24/17 0400 05/24/17 0600  AST 16 49* 64*  ALT 20 22 22   ALKPHOS 47 39 41  BILITOT 0.5 0.3 0.6  ALBUMIN 3.1* 2.7* 2.8*  Cardiac Enzymes No results for input(s): TROPONINI, PROBNP in the last 168 hours.  Glucose  Recent Labs Lab 05/23/17 1009 05/23/17 1441 05/23/17 1948 05/23/17 2323 05/24/17 0335  GLUCAP 272* 166* 134* 135* 133*    Imaging Dg Chest Port 1 View  Result Date: 05/24/2017 CLINICAL DATA:  Intubation EXAM: PORTABLE CHEST 1 VIEW COMPARISON:  05/23/2017 FINDINGS: Endotracheal tube in good position. Gastric tube enters the stomach. Left jugular central venous catheter tip in the SVC is unchanged. Improved aeration of the lungs with improved lung volume. Mild bibasilar airspace disease improved. No effusion IMPRESSION: Endotracheal tube in good position. Improved lung volume with decrease in bibasilar airspace disease. Electronically Signed   By: Marlan Palau M.D.   On: 05/24/2017 06:58   Dg Chest Port 1v Same  Day  Result Date: 05/23/2017 CLINICAL DATA:  Intubation. EXAM: PORTABLE CHEST 1 VIEW COMPARISON:  05/23/2017 at 1021 hours FINDINGS: A new endotracheal tube terminates at the clavicular heads, approximately 3.4 cm above the carina. A left jugular catheter terminates over the mid to lower SVC. An enteric tube terminates over the left upper quadrant, likely in the proximal stomach. The side hole of the tube projects over the distal esophagus. Lung volumes are diminished with central pulmonary vascular congestion. No lobar consolidation, overt edema, sizable pleural effusion, or pneumothorax is identified. IMPRESSION: 1. Support devices as above. 2. Low lung volumes with central pulmonary vascular congestion. Electronically Signed   By: Sebastian Ache M.D.   On: 05/23/2017 13:45    STUDIES:  -Head CT 10/17 - negative  -CXR 10/18 - Endotracheal tube in good position. Improved lung volume with decrease in bibasilar airspace disease.  CULTURES: None  ANTIBIOTICS: None   SIGNIFICANT EVENTS: 10/17 >> Admitted to ICU, HD initiated  LINES/TUBES: 10/17 >> ETT, NG/OG 10/17 >>  PIV x 2 10/17 >> Left IJ 10/17 >> Left radial arterial line 10/17 >> Foley  DISCUSSION: 36 yo man with PMHx significant for diabetes and schizophrenia admitted for salicylate poisoning.  Intubated on admission with plans for emergent HD.  ASSESSMENT / PLAN:  PULMONARY Acute respiratory failure from inability to adequately protect airway due to AMS 2/2 salicylate poisoning Repeat ABG with metabolic alkalosis likely 2/2 bicarb gtt  Stop bicarb gtt  Vent support - Plan to wean and extubate this morning  VAP prevention Follow CXR   CARDIOVASCULAR Sinus tachycardia Hypertension  Continue to monitor BP   RENAL Anion gap metabolic acidosis now with metabolic alkalosis 2/2 bicarb gtt  Hypokalemia  Acute kidney injury Nephrology following, s/p emergent HD Repeat  salicylate level 12 hours later - 4PM this afternoon   Stop bicarb gtt  BMET in AM  Strict I/Os  GASTROINTESTINAL No acute issues PPI Consider start TF if not extubated today   HEMATOLOGIC  DVT PPx Heparin SQ  INFECTIOUS Leukocytosis Likely related to stress, no other signs of infection Follow CBC  ENDOCRINE DM, last A1c 9.0 in June 2018 Hyperglycemia   CBG Q4H ASA intoxication may decrease cerebral glucose concentrations despite normal serum glucose so will monitor CBG prior to starting sliding scale insulin Holding home medications Sensitive SSI   NEUROLOGIC History of Schizophrenia  RASS goal: 0 to -1 D/C fentanyl Continue Propofol and wean  Restart home Risperdal    FAMILY  - Updates: family updated at bedside 10/18  - Inter-disciplinary family meet or Palliative Care meeting due by:  10/24   Freddrick March, MD  [facility], PGY-2

## 2017-05-24 NOTE — Progress Notes (Signed)
CRITICAL VALUE ALERT  Critical Value:  Potassium 2.4  Date & Time Notied:  05/24/17 at 1822  Provider Notified: Dr. Vassie LollAlva  Orders Received/Actions taken: IV potassium x 6 runs

## 2017-05-24 NOTE — Progress Notes (Signed)
Poison control called at this time and stated they are signing off at this time.

## 2017-05-25 LAB — BASIC METABOLIC PANEL
ANION GAP: 10 (ref 5–15)
Anion gap: 5 (ref 5–15)
Anion gap: 6 (ref 5–15)
BUN: 7 mg/dL (ref 6–20)
BUN: 7 mg/dL (ref 6–20)
BUN: 7 mg/dL (ref 6–20)
CALCIUM: 7 mg/dL — AB (ref 8.9–10.3)
CHLORIDE: 104 mmol/L (ref 101–111)
CHLORIDE: 107 mmol/L (ref 101–111)
CO2: 28 mmol/L (ref 22–32)
CO2: 27 mmol/L (ref 22–32)
CO2: 25 mmol/L (ref 22–32)
CREATININE: 0.91 mg/dL (ref 0.61–1.24)
Calcium: 6.8 mg/dL — ABNORMAL LOW (ref 8.9–10.3)
Calcium: 7.6 mg/dL — ABNORMAL LOW (ref 8.9–10.3)
Chloride: 103 mmol/L (ref 101–111)
Creatinine, Ser: 0.9 mg/dL (ref 0.61–1.24)
Creatinine, Ser: 0.86 mg/dL (ref 0.61–1.24)
GFR calc Af Amer: >60 mL/min (ref >60)
GFR calc non Af Amer: >60 mL/min (ref >60)
GFR calc non Af Amer: >60 mL/min (ref >60)
Glucose, Bld: 96 mg/dL (ref 65–99)
Glucose, Bld: 91 mg/dL (ref 65–99)
Glucose, Bld: 150 mg/dL — ABNORMAL HIGH (ref 65–99)
POTASSIUM: 2.5 mmol/L — AB (ref 3.5–5.1)
POTASSIUM: 2.8 mmol/L — AB (ref 3.5–5.1)
Potassium: 3.3 mmol/L — ABNORMAL LOW (ref 3.5–5.1)
SODIUM: 137 mmol/L (ref 135–145)
SODIUM: 138 mmol/L (ref 135–145)
Sodium: 140 mmol/L (ref 135–145)

## 2017-05-25 LAB — GLUCOSE, CAPILLARY
GLUCOSE-CAPILLARY: 106 mg/dL — AB (ref 65–99)
GLUCOSE-CAPILLARY: 134 mg/dL — AB (ref 65–99)
GLUCOSE-CAPILLARY: 101 mg/dL — AB (ref 65–99)
Glucose-Capillary: 90 mg/dL (ref 65–99)

## 2017-05-25 LAB — PHOSPHORUS: Phosphorus: 1.1 mg/dL — ABNORMAL LOW (ref 2.5–4.6)

## 2017-05-25 LAB — MAGNESIUM: MAGNESIUM: 1.9 mg/dL (ref 1.7–2.4)

## 2017-05-25 MED ORDER — POTASSIUM CHLORIDE CRYS ER 20 MEQ PO TBCR
40.0000 meq | EXTENDED_RELEASE_TABLET | ORAL | Status: DC
  Administered 2017-05-25 (×2): 40 meq via ORAL
  Filled 2017-05-25 (×3): qty 2

## 2017-05-25 MED ORDER — POTASSIUM CHLORIDE 10 MEQ/50ML IV SOLN
10.0000 meq | INTRAVENOUS | Status: AC
  Administered 2017-05-25 (×2): 10 meq via INTRAVENOUS

## 2017-05-25 MED ORDER — DEXTROSE 5 % IV SOLN
30.0000 mmol | Freq: Once | INTRAVENOUS | Status: AC
  Administered 2017-05-25: 30 mmol via INTRAVENOUS
  Filled 2017-05-25: qty 10

## 2017-05-25 MED ORDER — POTASSIUM CHLORIDE 10 MEQ/50ML IV SOLN
10.0000 meq | Freq: Once | INTRAVENOUS | Status: AC
  Administered 2017-05-25: 10 meq via INTRAVENOUS
  Filled 2017-05-25: qty 50

## 2017-05-25 NOTE — Progress Notes (Signed)
eLink Physician-Brief Progress Note Patient Name: Nicholas LemaDaniel Carson DOB: 1980/08/14 MRN: [id number]   Date of Service  05/25/2017  HPI/Events of Note  Low K still Advance to clears, add kcl oral and IV  eICU Interventions       Intervention Category Major Interventions: Electrolyte abnormality - evaluation and management  FEINSTEIN,Adom J. 05/25/2017, 3:22 AM

## 2017-05-25 NOTE — Progress Notes (Signed)
S: Nicholas Carson an 36 y.o.male. With PMHx significant for schizophrenia,DM and hypertriglyceridemia presented with salicylate toxicity. The patient was successfully extubated yesterday,feeling much better when seen this morning. Kidney functions has been normalized. According to patient he was recently using a lot of aspirin for his pain, to more than half a bottle on the morning of admission. He denies any suicidal attempt.  O:BP 135/83   Pulse 86   Temp 98.7 F (37.1 C) (Oral)   Resp 13   Ht 6\' 1"  (1.854 m)   Wt 284 lb 14.4 oz (129.2 kg)   SpO2 98%   BMI 37.59 kg/m   Intake/Output Summary (Last 24 hours) at 05/25/17 1131 Last data filed at 05/25/17 1000  Gross per 24 hour  Intake             4950 ml  Output             2135 ml  Net             2815 ml   Intake/Output: I/O last 3 completed shifts: In: 9741.7 [I.V.:9041.7; IV Piggyback:700] Out: 2690 [Urine:2690]  Intake/Output this shift:  Total I/O In: 10 [I.V.:10] Out: 520 [Urine:520] Weight change: 24 lb 14.4 oz (11.3 kg) Gen: well-developed, well-nourished, obese gentleman, in no acute distress. CVS: regular rate and rhythm. Resp: clear bilaterally. Abd: Soft, nontender, bowel sounds positive. Ext: no edema, no cyanosis, pulses intact and symmetrical.   Recent Labs Lab 05/23/17 1011 05/23/17 1500 05/23/17 2215 05/24/17 0400 05/24/17 0600 05/24/17 1600 05/25/17 0213 05/25/17 0445  NA 136 144 140 140 139 139 137 140  K 5.3* 4.3 2.4* 2.3* 2.2* 2.4* 2.5* 2.8*  CL 102 105 100* 98* 96* 101 104 103  CO2 15* 25 29 32 31 30 28 27   GLUCOSE 255* 191* 133* 139* 126* 109* 96 91  BUN 14 14 12 12 13 10 7 7   CREATININE 1.46* 1.49* 1.46* 1.46* 1.45* 1.15 0.90 0.86  ALBUMIN 3.6 3.1*  --  2.7* 2.8*  --   --   --   CALCIUM 8.8* 7.5* 7.4* 7.0* 6.9* 6.8* 6.8* 7.0*  PHOS  --   --   --   --  1.5*  --   --  1.1*  AST 29 16  --  49* 64*  --   --   --   ALT 22 20  --  22 22  --   --   --    Liver Function Tests:  Recent  Labs Lab 05/23/17 1500 05/24/17 0400 05/24/17 0600  AST 16 49* 64*  ALT 20 22 22   ALKPHOS 47 39 41  BILITOT 0.5 0.3 0.6  PROT 6.8 5.8* 5.8*  ALBUMIN 3.1* 2.7* 2.8*   No results for input(s): LIPASE, AMYLASE in the last 168 hours.  Recent Labs Lab 05/23/17 1033  AMMONIA 49*   CBC:  Recent Labs Lab 05/23/17 1011 05/24/17 0400  WBC 19.5* 11.2*  NEUTROABS 18.2*  --   HGB 15.6 12.0*  HCT 46.9 37.3*  MCV 82.9 83.1  PLT 309 212   Cardiac Enzymes:  Recent Labs Lab 05/23/17 1750  CKTOTAL 375   CBG:  Recent Labs Lab 05/24/17 1950 05/24/17 2336 05/25/17 0308 05/25/17 0750 05/25/17 1117  GLUCAP 99 86 90 106* 134*    Iron Studies: No results for input(s): IRON, TIBC, TRANSFERRIN, FERRITIN in the last 72 hours. Studies/Results: Dg Chest Port 1 View  Result Date: 05/24/2017 CLINICAL DATA:  Intubation EXAM: PORTABLE CHEST  1 VIEW COMPARISON:  05/23/2017 FINDINGS: Endotracheal tube in good position. Gastric tube enters the stomach. Left jugular central venous catheter tip in the SVC is unchanged. Improved aeration of the lungs with improved lung volume. Mild bibasilar airspace disease improved. No effusion IMPRESSION: Endotracheal tube in good position. Improved lung volume with decrease in bibasilar airspace disease. Electronically Signed   By: Marlan Palau M.D.   On: 05/24/2017 06:58   Dg Chest Port 1v Same Day  Result Date: 05/23/2017 CLINICAL DATA:  Intubation. EXAM: PORTABLE CHEST 1 VIEW COMPARISON:  05/23/2017 at 1021 hours FINDINGS: A new endotracheal tube terminates at the clavicular heads, approximately 3.4 cm above the carina. A left jugular catheter terminates over the mid to lower SVC. An enteric tube terminates over the left upper quadrant, likely in the proximal stomach. The side hole of the tube projects over the distal esophagus. Lung volumes are diminished with central pulmonary vascular congestion. No lobar consolidation, overt edema, sizable pleural  effusion, or pneumothorax is identified. IMPRESSION: 1. Support devices as above. 2. Low lung volumes with central pulmonary vascular congestion. Electronically Signed   By: Sebastian Ache M.D.   On: 05/23/2017 13:45   . Chlorhexidine Gluconate Cloth  6 each Topical Daily  . heparin  5,000 Units Subcutaneous Q8H  . insulin aspart  0-9 Units Subcutaneous TID WC  . mouth rinse  15 mL Mouth Rinse BID  . risperiDONE  2 mg Oral BID  . sodium chloride flush  10-40 mL Intracatheter Q12H    BMET    Component Value Date/Time   NA 140 05/25/2017 0445   K 2.8 (L) 05/25/2017 0445   CL 103 05/25/2017 0445   CO2 27 05/25/2017 0445   GLUCOSE 91 05/25/2017 0445   BUN 7 05/25/2017 0445   CREATININE 0.86 05/25/2017 0445   CALCIUM 7.0 (L) 05/25/2017 0445   GFRNONAA >60 05/25/2017 0445   GFRAA >60 05/25/2017 0445   CBC    Component Value Date/Time   WBC 11.2 (H) 05/24/2017 0400   RBC 4.49 05/24/2017 0400   HGB 12.0 (L) 05/24/2017 0400   HCT 37.3 (L) 05/24/2017 0400   PLT 212 05/24/2017 0400   MCV 83.1 05/24/2017 0400   MCH 26.7 05/24/2017 0400   MCHC 32.2 05/24/2017 0400   RDW 15.8 (H) 05/24/2017 0400   LYMPHSABS 0.9 05/23/2017 1011   MONOABS 0.3 05/23/2017 1011   EOSABS 0.0 05/23/2017 1011   BASOSABS 0.0 05/23/2017 1011     Assessment/Plan:  1. AKI. Resolved, most likely due to sepsis or toxicity.is salicylate levels has been normalized, last check was yesterday at 4 PM which was 13.6. He has hypokalemia which is being repleted. - Nephrology will sign off.  2. DM. Per primary team.  3.Schizophrenia. He was restarted on his home dose of risperidone.  I have seen and examined this patient and agree with plan and assessment in the above note with renal recommendations/intervention highlighted.  Marked clinical improvement.  Will sign off, no need for outpatient follow up as his renal function has returned to normal.  Jomarie Longs A Keven Soucy,MD 05/25/2017 2:08 PM   Irena Cords,  MD [facility] [phone]

## 2017-05-25 NOTE — Progress Notes (Signed)
PULMONARY / CRITICAL CARE MEDICINE   Name: Nicholas Carson MRN: [id number] DOB: 02/19/1981    ADMISSION DATE:  05/23/2017 CONSULTATION DATE:  05/23/2017  REFERRING MD:  Dr. Corlis LeakMackuen (ED)  CHIEF COMPLAINT:  Salicylate intoxication  HISTORY OF PRESENT ILLNESS:   This is a 36 y.o. man with PMHx of uncontrolled DM, schizophrenia, hypertriglyceridemia who presented with altered mental status 2/2 unintentional salicylate overdose leading to anion gap metabolic acidosis.  Patient unable to provide history so was obtained from EDP and grandmother at bedside.  Apparently patient was found stumbling across street this morning and fell as witnessed by bystanders.  Brought in via EMS.  On evaluation, patient found to have acute kidney injury, metabolic acidosis, critically elevated salicylate levels, and unable to answer questions or follow commands appropriately.  He was subsequently intubated for airway protection.  Nephrology was consulted for emergent dialysis.    REVIEW OF SYSTEMS:   Unable to obtain.   SUBJECTIVE  Pt had a good night overall, extubated 3PM yesterday afternoon with good O2 sats throughout the night on 4L per [place].  Weaned to 2L this morning with O2 sats 100%.  K+ critically low and repleted.  No acute events.    VITAL SIGNS: BP 139/86   Pulse 80   Temp 98.7 F (37.1 C) (Oral)   Resp 10   Ht 6\' 1"  (1.854 m)   Wt 284 lb 14.4 oz (129.2 kg)   SpO2 100%   BMI 37.59 kg/m   HEMODYNAMICS:    VENTILATOR SETTINGS: Not on vent.   INTAKE / OUTPUT: I/O last 3 completed shifts: In: 8356 [I.V.:7906; IV Piggyback:450] Out: 2710 [Urine:2710]   UOP: 4.1 L   PHYSICAL EXAMINATION: General:  [age] yo male sitting in chair at bedside, awake and alert  Neuro:  No focal deficits, able to follow commands  HEENT:  NCAT, PERRL, MMM, o/p clear, [place] in place  Cardiovascular:  RRR, no MRG  Lungs:  CTAB, non-laboured, upper respiratory secretions heard  Abdomen:  Obese, soft, NTND, +bs   Musculoskeletal:  No edema  Skin:  Warm, dry, no rash  LABS: Salicylate level 116>39.4 Initial ABG: pH 7.5, pCO2 36.8, bicarb 30.5  Repeat ABG: pH 7.6, pCO2 39.2, bicarb 38 K+ 2.8   Mag 1.9  Phos 1.1 low Salicylate level 62>46>13.6   BMET  Recent Labs Lab 05/24/17 1600 05/25/17 0213 05/25/17 0445  NA 139 137 140  K 2.4* 2.5* 2.8*  CL 101 104 103  CO2 30 28 27   BUN 10 7 7   CREATININE 1.15 0.90 0.86  GLUCOSE 109* 96 91   Electrolytes  Recent Labs Lab 05/23/17 1011  05/24/17 0600 05/24/17 1600 05/25/17 0213 05/25/17 0445  CALCIUM 8.8*  < > 6.9* 6.8* 6.8* 7.0*  MG 2.5*  --  2.0  --   --  1.9  PHOS  --   --  1.5*  --   --  1.1*  < > = values in this interval not displayed.  CBC  Recent Labs Lab 05/23/17 1011 05/24/17 0400  WBC 19.5* 11.2*  HGB 15.6 12.0*  HCT 46.9 37.3*  PLT 309 212    Coag's  Recent Labs Lab 05/23/17 1750  INR 1.89    Sepsis Markers  Recent Labs Lab 05/23/17 1026  LATICACIDVEN 2.48*    ABG  Recent Labs Lab 05/24/17 0240 05/24/17 0545 05/24/17 1015  PHART 7.567* 7.552* 7.602*  PCO2ART 36.9 39.6 39.2  PO2ART 111* 97.4 99.0    Liver Enzymes  Recent Labs Lab 05/23/17 1500 05/24/17 0400 05/24/17 0600  AST 16 49* 64*  ALT 20 22 22   ALKPHOS 47 39 41  BILITOT 0.5 0.3 0.6  ALBUMIN 3.1* 2.7* 2.8*    Cardiac Enzymes No results for input(s): TROPONINI, PROBNP in the last 168 hours.  Glucose  Recent Labs Lab 05/24/17 1148 05/24/17 1524 05/24/17 1711 05/24/17 1950 05/24/17 2336 05/25/17 0308  GLUCAP 112* 110* 98 99 86 90    Imaging STUDIES:  -Head CT 10/17 - negative  -CXR 10/18 - Endotracheal tube in good position. Improved lung volume with decrease in bibasilar airspace disease.  CULTURES: None  ANTIBIOTICS: None   SIGNIFICANT EVENTS: 10/17 >> Admitted to ICU, HD initiated  LINES/TUBES: 10/17 >> ETT, NG/OG 10/17 >>  PIV x 2 10/17 >> Left IJ 10/17 >> Left radial arterial line 10/17 >>  Foley  DISCUSSION: 36 yo man with PMHx significant for diabetes and schizophrenia admitted for salicylate poisoning.  Intubated on admission with plans for emergent HD.  ASSESSMENT / PLAN:  PULMONARY Acute respiratory failure from inability to adequately protect airway due to AMS 2/2 salicylate poisoning, improved Improved with bicarb gtt  S/p extubation 10/18 and on 2L [place]  Wean O2 as tolerated    CARDIOVASCULAR Sinus tachycardia  Hypertension  Continue to monitor BP   RENAL Acid-base abnormality corrected  Hypokalemia  Acute kidney injury Nephrology following, s/p emergent HD  Repeat Salicylate level 13  BMET in AM - SCr at baseline  Can remove HD cath today  Strict I/O's  GASTROINTESTINAL No acute issues Protonix  Clear Liquid diet   HEMATOLOGIC  DVT PPx-  Heparin SQ SCDs  INFECTIOUS Leukocytosis Likely related to stress, no other signs of infection  WBC down-trending  Follow CBC   ENDOCRINE DM, last A1c 9.0 in June 2018 Hyperglycemia   CBG monitoring  Holding home medications Sensitive SSI   NEUROLOGIC History of Schizophrenia  Mental status is at baseline  Off sedation   Continue home Risperdal    FAMILY  - Updates: family updated at bedside 10/18  - Inter-disciplinary family meet or Palliative Care meeting due by:  10/24   Freddrick March, MD  [facility], PGY-2

## 2017-05-26 DIAGNOSIS — F200 Paranoid schizophrenia: Secondary | ICD-10-CM

## 2017-05-26 HISTORY — DX: Paranoid schizophrenia: F20.0

## 2017-05-26 LAB — GLUCOSE, CAPILLARY
GLUCOSE-CAPILLARY: 113 mg/dL — AB (ref 65–99)
GLUCOSE-CAPILLARY: 136 mg/dL — AB (ref 65–99)
Glucose-Capillary: 85 mg/dL (ref 65–99)

## 2017-05-26 LAB — PHOSPHORUS: Phosphorus: 1.4 mg/dL — ABNORMAL LOW (ref 2.5–4.6)

## 2017-05-26 LAB — MAGNESIUM: Magnesium: 2 mg/dL (ref 1.7–2.4)

## 2017-05-26 MED ORDER — DEXTROSE 5 % IV SOLN
30.0000 mmol | Freq: Once | INTRAVENOUS | Status: AC
  Administered 2017-05-26: 30 mmol via INTRAVENOUS
  Filled 2017-05-26: qty 10

## 2017-05-26 MED ORDER — FENOFIBRATE 145 MG PO TABS: 145.0000 mg | ORAL_TABLET | Freq: Every day | ORAL | Status: AC

## 2017-05-26 MED ORDER — NICOTINE 14 MG/24HR TD PT24: 14.0000 mg | MEDICATED_PATCH | Freq: Every day | TRANSDERMAL | 0 refills | Status: AC

## 2017-05-26 MED ORDER — NICOTINE 14 MG/24HR TD PT24
14.0000 mg | MEDICATED_PATCH | Freq: Every day | TRANSDERMAL | Status: DC
  Administered 2017-05-26: 14 mg via TRANSDERMAL
  Filled 2017-05-26: qty 1

## 2017-05-26 NOTE — Discharge Summary (Signed)
Physician Discharge Summary  Nicholas LemaDaniel Carson ZOX:[id number] DOB: 1981-07-31 DOA: 05/23/2017  PCP: Verlon AuBoyd, Tammy Lamonica, MD  Admit date: 05/23/2017 Discharge date: 05/26/2017  Admitted From: Home Disposition: Home  Recommendations for Outpatient Follow-up:  1. Follow up with PCP in 1 week 2. Please obtain BMP/CBC in one week to recheck electrolytes and hemolgobin 3. Please follow up on the following pending results: None  Home Health: None Equipment/Devices: None  Discharge Condition: Stable CODE STATUS: Full code Diet recommendation: Heart healthy   Brief/Interim Summary:  Admission HPI written by Nelda Bucksaniel J Feinstein, MD   CHIEF COMPLAINT:  Salicylate intoxication  HISTORY OF PRESENT ILLNESS:   This is a 36 y.o. man with PMHx of uncontrolled DM, schizophrenia, hypertriglyceridemia who presented with altered mental status.  Patient unable to provide history so was obtained from EDP and grandmother at bedside.  Apparently patient was found stumbling across street this morning and fell as witnessed by bystanders.  Brought in via EMS.  On evaluation, patient found to have acute kidney injury, metabolic acidosis, critically elevated salicylate levels, and unable to answer questions or follow commands appropriately.  He was subsequently intubated for airway protection.  Nephrology was consulted for emergent dialysis.    Hospital course:  Salicylate intoxication Patient presented with a salicylate level of 116.7 (normal < 30).  Patient required ventilation support on admission in addition to emergent hemodialysis.  He was given sodium bicarb to alkalinize his serum and urine. Patient extubated on 10/19 and transferred out of the ICU.  Salicylate level improved to 13.6 2prior to discharge.  Patient reports that he ingested too much aspirin accidentally, trying to treat some acute pain.  He reports no suicidal ideation.  Acute metabolic encephalopathy Secondary to salicylate poisoning.  Resolved.  Schizophrenia Continued home Risperdal  Diabetes mellitus Sliding scale insulin while inpatient.   Discharge Diagnoses:  Active Problems:   Salicylate intoxication   Acute kidney injury (HCC)   Metabolic acidosis due to salicylate   Leukocytosis   Hyperglycemia   Diabetes (HCC)   Respiratory failure, acute (HCC)   On mechanically assisted ventilation (HCC)   Encounter for intubation   Paranoid schizophrenia [facility](HCC)    Discharge Instructions  Discharge Instructions    Diet - low sodium heart healthy    Complete by:  As directed    Increase activity slowly    Complete by:  As directed      Allergies as of 05/26/2017   No Known Allergies     Medication List    TAKE these medications   fenofibrate 145 MG tablet Commonly known as:  TRICOR Take 1 tablet (145 mg total) by mouth daily. What changed:  how much to take   JANUMET XR 50-1000 MG Tb24 Generic drug:  SitaGLIPtin-MetFORMIN HCl Take 1 tablet by mouth 2 (two) times daily.   nicotine 14 mg/24hr patch Commonly known as:  NICODERM CQ - dosed in mg/24 hours Place 1 patch (14 mg total) onto the skin daily.   RESTASIS 0.05 % ophthalmic emulsion Generic drug:  cycloSPORINE Place 1 drop into the left eye 2 (two) times daily.   risperiDONE 2 MG tablet Commonly known as:  RISPERDAL Take 1-3 mg by mouth 2 (two) times daily. 1 mg qam 3 mg qhs   VENTOLIN HFA 108 (90 Base) MCG/ACT inhaler Generic drug:  albuterol Inhale 2 puffs into the lungs every 6 (six) hours as needed.       No Known Allergies  Consultations:  PCCM  Nephrology  Procedures/Studies: Ct Head Wo Contrast  Result Date: 05/23/2017 CLINICAL DATA:  Found unresponsive. EXAM: CT HEAD WITHOUT CONTRAST TECHNIQUE: Contiguous axial images were obtained from the base of the skull through the vertex without intravenous contrast. COMPARISON:  None. FINDINGS: Brain: No acute intracranial abnormality. Specifically, no hemorrhage,  hydrocephalus, mass lesion, acute infarction, or significant intracranial injury. Vascular: No hyperdense vessel or unexpected calcification. Skull: No acute calvarial abnormality. Sinuses/Orbits: Visualized paranasal sinuses and mastoids clear. Orbital soft tissues unremarkable. Other: None IMPRESSION: No acute intracranial abnormality. Electronically Signed   By: Charlett Nose M.D.   On: 05/23/2017 10:25   Dg Chest Port 1 View  Result Date: 05/24/2017 CLINICAL DATA:  Intubation EXAM: PORTABLE CHEST 1 VIEW COMPARISON:  05/23/2017 FINDINGS: Endotracheal tube in good position. Gastric tube enters the stomach. Left jugular central venous catheter tip in the SVC is unchanged. Improved aeration of the lungs with improved lung volume. Mild bibasilar airspace disease improved. No effusion IMPRESSION: Endotracheal tube in good position. Improved lung volume with decrease in bibasilar airspace disease. Electronically Signed   By: Marlan Palau M.D.   On: 05/24/2017 06:58   Dg Chest Portable 1 View  Result Date: 05/23/2017 CLINICAL DATA:  Pt found unresponsive by bystanders in a parking lot today. Pt is awake, but snoring and diaphoretic. He does not appear to be able to answer questions with more than a nod. Hx of DM. EXAM: PORTABLE CHEST 1 VIEW COMPARISON:  None. FINDINGS: Normal mediastinum and cardiac silhouette. Normal pulmonary vasculature. No evidence of effusion, infiltrate, or pneumothorax. No acute bony abnormality. IMPRESSION: Normal chest radiograph Electronically Signed   By: Genevive Bi M.D.   On: 05/23/2017 10:37   Dg Chest Port 1v Same Day  Result Date: 05/23/2017 CLINICAL DATA:  Intubation. EXAM: PORTABLE CHEST 1 VIEW COMPARISON:  05/23/2017 at 1021 hours FINDINGS: A new endotracheal tube terminates at the clavicular heads, approximately 3.4 cm above the carina. A left jugular catheter terminates over the mid to lower SVC. An enteric tube terminates over the left upper quadrant, likely in  the proximal stomach. The side hole of the tube projects over the distal esophagus. Lung volumes are diminished with central pulmonary vascular congestion. No lobar consolidation, overt edema, sizable pleural effusion, or pneumothorax is identified. IMPRESSION: 1. Support devices as above. 2. Low lung volumes with central pulmonary vascular congestion. Electronically Signed   By: Sebastian Ache M.D.   On: 05/23/2017 13:45       Subjective: No concerns today. No chest pain or dyspnea. Urinating.  Discharge Exam: Vitals:   05/26/17 0700 05/26/17 0701  BP: 138/77 (!) 142/79  Pulse: 67 (!) 59  Resp: 12 14  Temp: 98.7 F (37.1 C) 98.2 F (36.8 C)  SpO2: 98% 100%   Vitals:   05/25/17 2000 05/25/17 2235 05/26/17 0700 05/26/17 0701  BP: 129/72 137/65 138/77 (!) 142/79  Pulse: 88 76 67 (!) 59  Resp: 14 15 12 14   Temp:  98.7 F (37.1 C) 98.7 F (37.1 C) 98.2 F (36.8 C)  TempSrc:  Oral Oral Oral  SpO2: 98% 100% 98% 100%  Weight:  127.9 kg (281 lb 14.4 oz)    Height:        General: Pt is alert, awake, not in acute distress Cardiovascular: RRR, S1/S2 +, no rubs, no gallops Respiratory: CTA bilaterally, no wheezing, no rhonchi Abdominal: Soft, NT, ND, bowel sounds + Extremities: no edema, no cyanosis Psych: No suicidal ideation, no homicidal ideation    The results  of significant diagnostics from this hospitalization (including imaging, microbiology, ancillary and laboratory) are listed below for reference.     Microbiology: Recent Results (from the past 240 hour(s))  MRSA PCR Screening     Status: None   Collection Time: 05/23/17  2:57 PM  Result Value Ref Range Status   MRSA by PCR NEGATIVE NEGATIVE Final    Comment:        The GeneXpert MRSA Assay (FDA approved for NASAL specimens only), is one component of a comprehensive MRSA colonization surveillance program. It is not intended to diagnose MRSA infection nor to guide or monitor treatment for MRSA infections.       Labs: BNP (last 3 results) No results for input(s): BNP in the last 8760 hours. Basic Metabolic Panel:  Recent Labs Lab 05/23/17 1011  05/24/17 0600 05/24/17 1600 05/25/17 0213 05/25/17 0445 05/25/17 1544 05/26/17 0237  NA 136  < > 139 139 137 140 138  --   K 5.3*  < > 2.2* 2.4* 2.5* 2.8* 3.3*  --   CL 102  < > 96* 101 104 103 107  --   CO2 15*  < > 31 30 28 27 25   --   GLUCOSE 255*  < > 126* 109* 96 91 150*  --   BUN 14  < > 13 10 7 7 7   --   CREATININE 1.46*  < > 1.45* 1.15 0.90 0.86 0.91  --   CALCIUM 8.8*  < > 6.9* 6.8* 6.8* 7.0* 7.6*  --   MG 2.5*  --  2.0  --   --  1.9  --  2.0  PHOS  --   --  1.5*  --   --  1.1*  --  1.4*  < > = values in this interval not displayed. Liver Function Tests:  Recent Labs Lab 05/23/17 1011 05/23/17 1500 05/24/17 0400 05/24/17 0600  AST 29 16 49* 64*  ALT 22 20 22 22   ALKPHOS 57 47 39 41  BILITOT 0.9 0.5 0.3 0.6  PROT 7.9 6.8 5.8* 5.8*  ALBUMIN 3.6 3.1* 2.7* 2.8*   No results for input(s): LIPASE, AMYLASE in the last 168 hours.  Recent Labs Lab 05/23/17 1033  AMMONIA 49*   CBC:  Recent Labs Lab 05/23/17 1011 05/24/17 0400  WBC 19.5* 11.2*  NEUTROABS 18.2*  --   HGB 15.6 12.0*  HCT 46.9 37.3*  MCV 82.9 83.1  PLT 309 212   Cardiac Enzymes:  Recent Labs Lab 05/23/17 1750  CKTOTAL 375   BNP: Invalid input(s): POCBNP CBG:  Recent Labs Lab 05/25/17 0750 05/25/17 1117 05/25/17 2239 05/26/17 0746 05/26/17 1150  GLUCAP 106* 134* 101* 113* 136*   D-Dimer No results for input(s): DDIMER in the last 72 hours. Hgb A1c No results for input(s): HGBA1C in the last 72 hours. Lipid Profile  Recent Labs  05/23/17 1236  TRIG 134   Thyroid function studies No results for input(s): TSH, T4TOTAL, T3FREE, THYROIDAB in the last 72 hours.  Invalid input(s): FREET3 Anemia work up No results for input(s): VITAMINB12, FOLATE, FERRITIN, TIBC, IRON, RETICCTPCT in the last 72 hours. Urinalysis    Component  Value Date/Time   COLORURINE AMBER (A) 05/23/2017 2227   APPEARANCEUR CLOUDY (A) 05/23/2017 2227   LABSPEC 1.036 (H) 05/23/2017 2227   PHURINE 5.0 05/23/2017 2227   GLUCOSEU NEGATIVE 05/23/2017 2227   HGBUR SMALL (A) 05/23/2017 2227   BILIRUBINUR NEGATIVE 05/23/2017 2227   KETONESUR 5 (A) 05/23/2017 2227  PROTEINUR 100 (A) 05/23/2017 2227   NITRITE NEGATIVE 05/23/2017 2227   LEUKOCYTESUR NEGATIVE 05/23/2017 2227   Sepsis Labs Invalid input(s): PROCALCITONIN,  WBC,  LACTICIDVEN Microbiology Recent Results (from the past 240 hour(s))  MRSA PCR Screening     Status: None   Collection Time: 05/23/17  2:57 PM  Result Value Ref Range Status   MRSA by PCR NEGATIVE NEGATIVE Final    Comment:        The GeneXpert MRSA Assay (FDA approved for NASAL specimens only), is one component of a comprehensive MRSA colonization surveillance program. It is not intended to diagnose MRSA infection nor to guide or monitor treatment for MRSA infections.      SIGNED:   Jacquelin Hawking, MD [facility] 05/26/2017, 12:30 PM Pager [phone]  If 7PM-7AM, please contact night-coverage [email or web address] Password [email or web address]

## 2017-05-26 NOTE — Discharge Instructions (Signed)
Aspirin Overdose Aspirin is a medicine that is commonly used to treat fever, pain, and inflammation. It is found in many prescription medicines and over-the-counter medicines, including:  Pain medicines.  Cold medicines.  Medicines for diarrhea or upset stomach.  Herbal medicines.  An aspirin overdose happens when you take too much aspirin or too much of a medicine that is like aspirin. It can happen if you take too much at one time (acute overdose) or too much over a long period of time (chronic overdose). An aspirin overdose can result in serious health problems, such as organ failure. What increases the risk? This condition is more likely to develop in:  Infants and elderly adults.  People who do not drink enough fluids while taking aspirin.  People who are taking aspirin and have a fever or become overheated.  People who have kidney problems.  What are the signs or symptoms? Symptoms of this condition include:  Nausea.  Vomiting.  Fever.  Ringing in the ears.  Confusion.  Hyperactivity.  Fatigue.  Rapid breathing.  Having trouble breathing.  Low blood pressure.  Seizure.  Loss of consciousness.  How is this diagnosed? This condition can be diagnosed based on:  Your symptoms.  Your history of taking aspirin.  A physical exam.  Tests. These may check: ? The amount of aspirin in your blood. ? The level of oxygen in your blood. ? Acid levels (pH) in your blood and urine. ? Your blood minerals (electrolytes). ? Your blood sugar (glucose). ? The amount of protein in your blood and urine.  How is this treated? Treatment for this condition depends on how severe the overdose is, when you last took the aspirin, and how much you took. Treatment may involve:  Taking activated charcoal. The charcoal may be swallowed or may be given through a tube that goes from the nose to the stomach (nasogastric tube). The charcoal will absorb aspirin in your  stomach.  Getting IV fluids and medicines. These are given to correct your blood pH and to restore electrolyte balance.  Taking medicines to prevent seizures.  Taking laxatives. Laxatives will help remove aspirin from the body.  Cooling the body with an ice bath. This may be done to bring down a fever.  A procedure to filter the aspirin from the blood (hemodialysis). This may be needed in severe cases.  Follow these instructions at home:  Drink enough fluids to keep your urine clear or pale yellow. How is this prevented?  Carefully read the labels of all medicines that you take. These include prescription medicines, over-the-counter medicines, herbal supplements, and vitamins.  Check with your health care provider or pharmacist before you start any new medicine or supplement. Contact a health care provider if:  You continue to have symptoms of an aspirin overdose.  You become sluggish or confused. This information is not intended to replace advice given to you by your health care provider. Make sure you discuss any questions you have with your health care provider. Document Released: 08/31/2004 Document Revised: 02/20/2016 Document Reviewed: 01/09/2016 Elsevier Interactive Patient Education  [employer]2018 [employer]

## 2017-05-27 LAB — GLUCOSE, CAPILLARY
Glucose-Capillary: 87 mg/dL (ref 65–99)
Glucose-Capillary: 151 mg/dL — ABNORMAL HIGH (ref 65–99)

## 2018-08-31 IMAGING — DX DG CHEST 1V PORT
1 series · 1 of 1 positions shown · non-contrast
Comparison: 05/23/2017

CLINICAL DATA: Intubation

EXAM:
PORTABLE CHEST 1 VIEW

[chest ap]
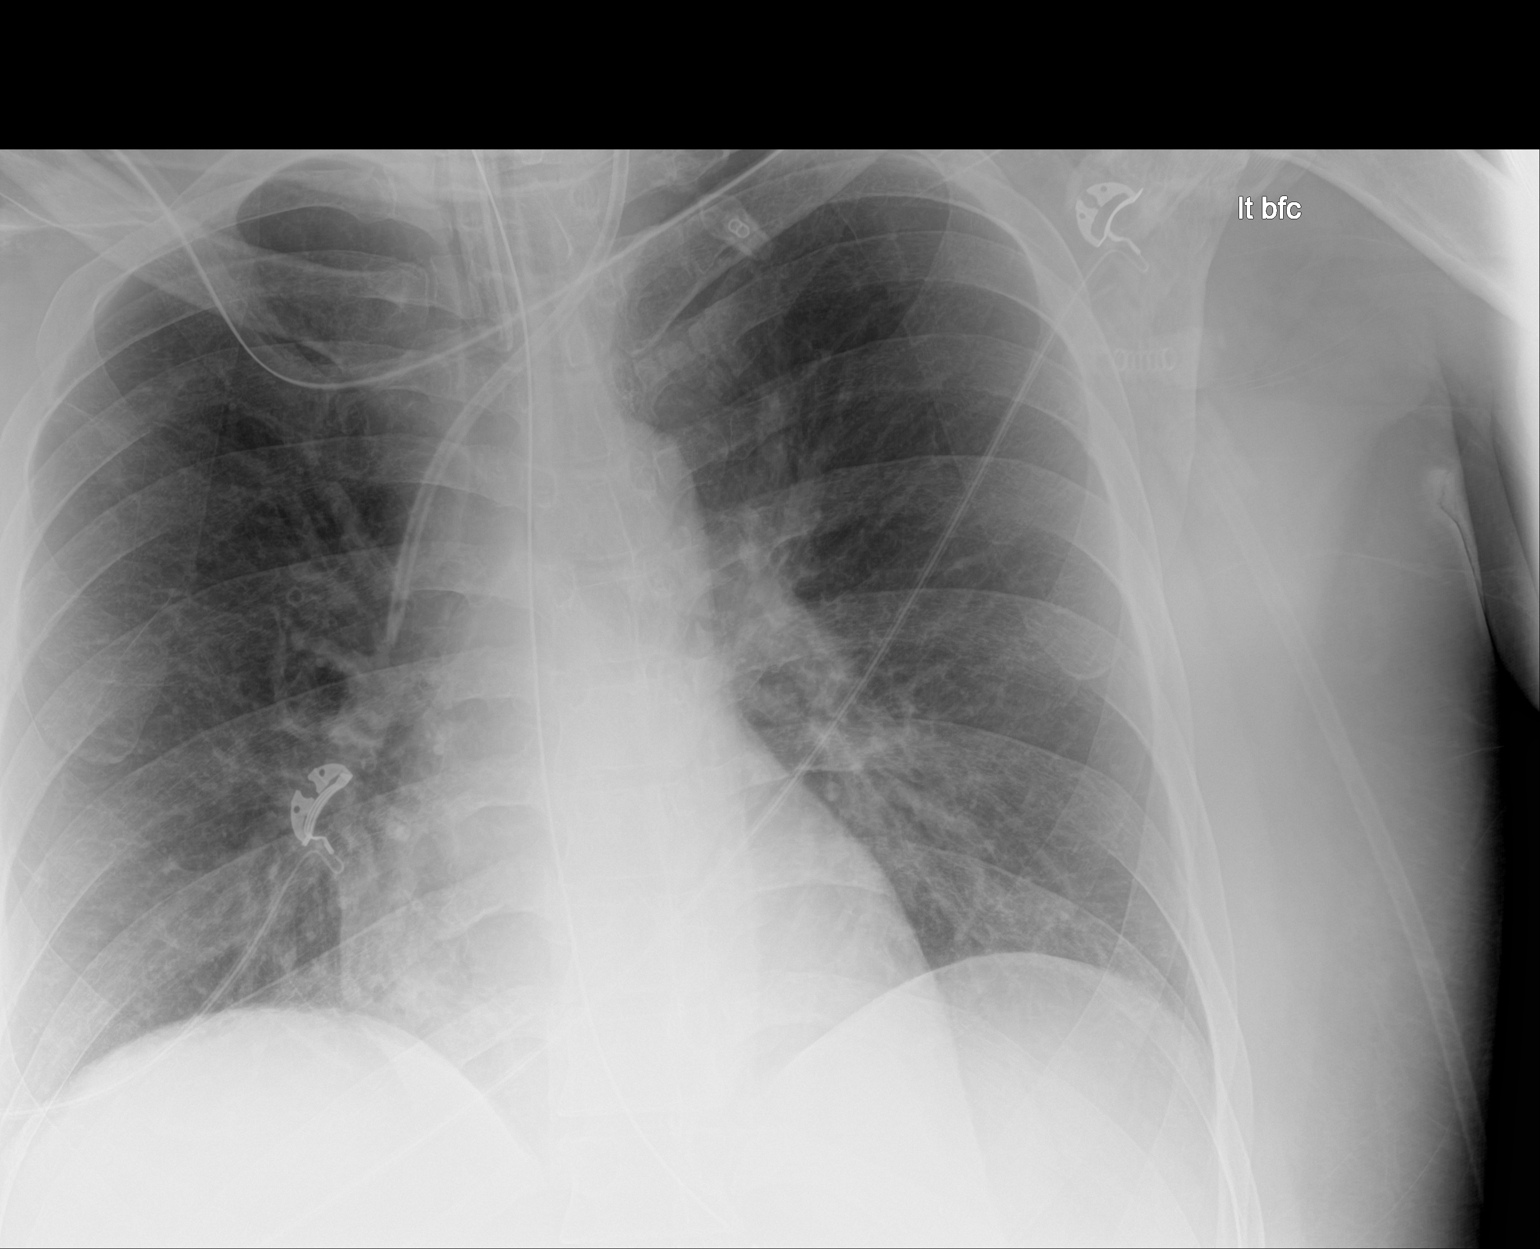

[1 of 1 positions shown; findings below may reference images not displayed]

FINDINGS: Endotracheal tube in good position. Gastric tube enters the stomach.
Left jugular central venous catheter tip in the SVC is unchanged.

Improved aeration of the lungs with improved lung volume. Mild
bibasilar airspace disease improved. No effusion
IMPRESSION: Endotracheal tube in good position. Improved lung volume with
decrease in bibasilar airspace disease.

## 2021-08-11 ENCOUNTER — Ambulatory Visit (HOSPITAL_COMMUNITY)
Admission: EM | Admit: 2021-08-11 | Discharge: 2021-08-11 | Disposition: A | Discharge status: Other Acute Inpatient Hospital | Payer: Medicare Other | Source: Home / Self Care

## 2021-08-11 ENCOUNTER — Other Ambulatory Visit: Payer: Self-pay

## 2021-08-11 ENCOUNTER — Emergency Department (HOSPITAL_COMMUNITY)
Admission: EM | Admit: 2021-08-11 | Discharge: 2021-08-12 | Disposition: A | Discharge status: Other Acute Inpatient Hospital | Payer: Medicare Other | Attending: Emergency Medicine | Admitting: Emergency Medicine

## 2021-08-11 DIAGNOSIS — F203 Undifferentiated schizophrenia: Secondary | ICD-10-CM

## 2021-08-11 DIAGNOSIS — R9431 Abnormal electrocardiogram [ECG] [EKG]: Secondary | ICD-10-CM | POA: Diagnosis not present

## 2021-08-11 DIAGNOSIS — F200 Paranoid schizophrenia: Secondary | ICD-10-CM | POA: Insufficient documentation

## 2021-08-11 DIAGNOSIS — Z7984 Long term (current) use of oral hypoglycemic drugs: Secondary | ICD-10-CM | POA: Insufficient documentation

## 2021-08-11 DIAGNOSIS — S0502XA Injury of conjunctiva and corneal abrasion without foreign body, left eye, initial encounter: Secondary | ICD-10-CM | POA: Diagnosis not present

## 2021-08-11 DIAGNOSIS — F1290 Cannabis use, unspecified, uncomplicated: Secondary | ICD-10-CM | POA: Insufficient documentation

## 2021-08-11 DIAGNOSIS — F1090 Alcohol use, unspecified, uncomplicated: Secondary | ICD-10-CM | POA: Insufficient documentation

## 2021-08-11 DIAGNOSIS — F29 Unspecified psychosis not due to a substance or known physiological condition: Secondary | ICD-10-CM | POA: Diagnosis not present

## 2021-08-11 DIAGNOSIS — Z20822 Contact with and (suspected) exposure to covid-19: Secondary | ICD-10-CM | POA: Insufficient documentation

## 2021-08-11 DIAGNOSIS — Z91128 Patient's intentional underdosing of medication regimen for other reason: Secondary | ICD-10-CM | POA: Insufficient documentation

## 2021-08-11 DIAGNOSIS — X58XXXA Exposure to other specified factors, initial encounter: Secondary | ICD-10-CM | POA: Insufficient documentation

## 2021-08-11 DIAGNOSIS — Z9114 Patient's other noncompliance with medication regimen: Secondary | ICD-10-CM | POA: Insufficient documentation

## 2021-08-11 DIAGNOSIS — T50916A Underdosing of multiple unspecified drugs, medicaments and biological substances, initial encounter: Secondary | ICD-10-CM | POA: Insufficient documentation

## 2021-08-11 DIAGNOSIS — E1165 Type 2 diabetes mellitus with hyperglycemia: Secondary | ICD-10-CM | POA: Insufficient documentation

## 2021-08-11 DIAGNOSIS — Z046 Encounter for general psychiatric examination, requested by authority: Secondary | ICD-10-CM | POA: Diagnosis present

## 2021-08-11 DIAGNOSIS — E119 Type 2 diabetes mellitus without complications: Secondary | ICD-10-CM | POA: Insufficient documentation

## 2021-08-11 LAB — COMPREHENSIVE METABOLIC PANEL
ALT: 25 U/L (ref 0–44)
AST: 30 U/L (ref 15–41)
Albumin: 3.7 g/dL (ref 3.5–5.0)
Alkaline Phosphatase: 46 U/L (ref 38–126)
Anion gap: 5 (ref 5–15)
BUN: 17 mg/dL (ref 6–20)
CO2: 25 mmol/L (ref 22–32)
Calcium: 8.9 mg/dL (ref 8.9–10.3)
Chloride: 106 mmol/L (ref 98–111)
Creatinine, Ser: 0.69 mg/dL (ref 0.61–1.24)
GFR, Estimated: >60 mL/min (ref >60)
Glucose, Bld: 107 mg/dL — ABNORMAL HIGH (ref 70–99)
Potassium: 3.7 mmol/L (ref 3.5–5.1)
Sodium: 136 mmol/L (ref 135–145)
Total Bilirubin: 0.9 mg/dL (ref 0.3–1.2)
Total Protein: 6.6 g/dL (ref 6.5–8.1)

## 2021-08-11 LAB — RESP PANEL BY RT-PCR (FLU A&B, COVID) ARPGX2
Influenza A by PCR: NEGATIVE
Influenza B by PCR: NEGATIVE
SARS Coronavirus 2 by RT PCR: NEGATIVE

## 2021-08-11 LAB — CBG MONITORING, ED: Glucose-Capillary: 115 mg/dL — ABNORMAL HIGH (ref 70–99)

## 2021-08-11 LAB — CBC WITH DIFFERENTIAL/PLATELET
Abs Immature Granulocytes: 0.03 10*3/uL (ref 0.00–0.07)
Basophils Absolute: 0 10*3/uL (ref 0.0–0.1)
Basophils Relative: 0 %
Eosinophils Absolute: 0.1 10*3/uL (ref 0.0–0.5)
Eosinophils Relative: 1 %
HCT: 43.5 % (ref 39.0–52.0)
Hemoglobin: 13.7 g/dL (ref 13.0–17.0)
Immature Granulocytes: 0 %
Lymphocytes Relative: 22 %
Lymphs Abs: 2.2 10*3/uL (ref 0.7–4.0)
MCH: 28 pg (ref 26.0–34.0)
MCHC: 31.5 g/dL (ref 30.0–36.0)
MCV: 88.8 fL (ref 80.0–100.0)
Monocytes Absolute: 0.5 10*3/uL (ref 0.1–1.0)
Monocytes Relative: 5 %
Neutro Abs: 7.3 10*3/uL (ref 1.7–7.7)
Neutrophils Relative %: 72 %
Platelets: 219 10*3/uL (ref 150–400)
RBC: 4.9 MIL/uL (ref 4.22–5.81)
RDW: 14 % (ref 11.5–15.5)
WBC: 10.1 10*3/uL (ref 4.0–10.5)
nRBC: 0 % (ref 0.0–0.2)

## 2021-08-11 LAB — I-STAT VENOUS BLOOD GAS, ED
Acid-Base Excess: 1 mmol/L (ref 0.0–2.0)
Bicarbonate: 25.4 mmol/L (ref 20.0–28.0)
Calcium, Ion: 1.07 mmol/L — ABNORMAL LOW (ref 1.15–1.40)
HCT: 43 % (ref 39.0–52.0)
Hemoglobin: 14.6 g/dL (ref 13.0–17.0)
O2 Saturation: 99 %
Potassium: 3.7 mmol/L (ref 3.5–5.1)
Sodium: 138 mmol/L (ref 135–145)
TCO2: 27 mmol/L (ref 22–32)
pCO2, Ven: 37.7 mmHg — ABNORMAL LOW (ref 44.0–60.0)
pH, Ven: 7.436 — ABNORMAL HIGH (ref 7.250–7.430)
pO2, Ven: 141 mmHg — ABNORMAL HIGH (ref 32.0–45.0)

## 2021-08-11 LAB — BETA-HYDROXYBUTYRIC ACID: Beta-Hydroxybutyric Acid: 0.33 mmol/L — ABNORMAL HIGH (ref 0.05–0.27)

## 2021-08-11 LAB — ETHANOL: Alcohol, Ethyl (B): <10 mg/dL (ref <10)

## 2021-08-11 MED ORDER — CIPROFLOXACIN HCL 0.3 % OP SOLN
2.0000 [drp] | OPHTHALMIC | Status: DC
  Filled 2021-08-11: qty 2.5

## 2021-08-11 MED ORDER — HYDROXYZINE HCL 25 MG PO TABS: 25.0000 mg | ORAL_TABLET | Freq: Three times a day (TID) | ORAL | Status: DC | PRN

## 2021-08-11 MED ORDER — ERYTHROMYCIN 5 MG/GM OP OINT: 1.0000 "application " | TOPICAL_OINTMENT | Freq: Once | OPHTHALMIC | Status: DC

## 2021-08-11 MED ORDER — ACETAMINOPHEN 325 MG PO TABS: 650.0000 mg | ORAL_TABLET | ORAL | Status: DC | PRN

## 2021-08-11 MED ORDER — LORAZEPAM 2 MG/ML IJ SOLN
2.0000 mg | Freq: Once | INTRAMUSCULAR | Status: AC
Start: 2021-08-11 — End: 2021-08-11
  Administered 2021-08-11: 2 mg via INTRAMUSCULAR
  Filled 2021-08-11: qty 1

## 2021-08-11 MED ORDER — TRAZODONE HCL 50 MG PO TABS: 50.0000 mg | ORAL_TABLET | Freq: Every evening | ORAL | Status: DC | PRN

## 2021-08-11 MED ORDER — ACETAMINOPHEN 325 MG PO TABS: 650.0000 mg | ORAL_TABLET | Freq: Four times a day (QID) | ORAL | Status: DC | PRN

## 2021-08-11 MED ORDER — ONDANSETRON HCL 4 MG PO TABS: 4.0000 mg | ORAL_TABLET | Freq: Three times a day (TID) | ORAL | Status: DC | PRN

## 2021-08-11 MED ORDER — TETRACAINE HCL 0.5 % OP SOLN
2.0000 [drp] | Freq: Once | OPHTHALMIC | Status: AC
  Administered 2021-08-11: 2 [drp] via OPHTHALMIC
  Filled 2021-08-11: qty 4

## 2021-08-11 MED ORDER — ZIPRASIDONE MESYLATE 20 MG IM SOLR
20.0000 mg | Freq: Once | INTRAMUSCULAR | Status: AC
  Administered 2021-08-11: 20 mg via INTRAMUSCULAR

## 2021-08-11 MED ORDER — ALUM & MAG HYDROXIDE-SIMETH 200-200-20 MG/5ML PO SUSP: 30.0000 mL | ORAL | Status: DC | PRN

## 2021-08-11 MED ORDER — MAGNESIUM HYDROXIDE 400 MG/5ML PO SUSP: 30.0000 mL | Freq: Every day | ORAL | Status: DC | PRN

## 2021-08-11 MED ORDER — ALUM & MAG HYDROXIDE-SIMETH 200-200-20 MG/5ML PO SUSP: 30.0000 mL | Freq: Four times a day (QID) | ORAL | Status: DC | PRN

## 2021-08-11 MED ORDER — FLUORESCEIN SODIUM 1 MG OP STRP
1.0000 | ORAL_STRIP | Freq: Once | OPHTHALMIC | Status: AC
  Administered 2021-08-11: 1 via OPHTHALMIC
  Filled 2021-08-11: qty 1

## 2021-08-11 MED ORDER — NICOTINE 21 MG/24HR TD PT24
21.0000 mg | MEDICATED_PATCH | Freq: Once | TRANSDERMAL | Status: DC
  Administered 2021-08-11: 21 mg via TRANSDERMAL
  Filled 2021-08-11: qty 1

## 2021-08-11 NOTE — ED Notes (Signed)
Mother Kathrynn Speed [phone] would like an update

## 2021-08-11 NOTE — ED Notes (Signed)
Received verbal report from Catherine W. RN at this time 

## 2021-08-11 NOTE — Progress Notes (Signed)
Patient yelling, screaming banging on walls in assessment room. Patient does not want to stay although petitioned by family. 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 Patient seems to not be appropriate on an open unit with peers due to behavior.

## 2021-08-11 NOTE — Progress Notes (Signed)
Cleotis Lema transferred to [facility] per FNP order. An EMTALA and Med Necessity were printed and to be given to the receiving nurse. Patient escorted out, and transferred via [employer].  Dickie La  08/11/2021 1:55 PM

## 2021-08-11 NOTE — Progress Notes (Signed)
Patient was brought to the [facility] on IVC petitioned by his family.  Patient is diagnosed with schizophrenia and he has diabetes.  Patient is not taking his medications for either condition, he is not bathing.  Patient appears to be responding to internal stimuli and he is flailing his erms around and not saying anything.  Patient was observed talking to the floor.  Patient denies SI/H.  He [place] disorganized and smells strongly of marijuana.  Patient is emergent.

## 2021-08-11 NOTE — Progress Notes (Signed)
CSW went to see patient in the assessment room.  Patient is covered in dirt/filth and reports that he has not taken a shower in a while because the water has been off.  Patient reports that he lives with his grandmother and his grandmother is his payee.  Patient also reports that he has not eaten in days.  Patient is a diabetic.  CSW attempted to make an APS report on the given information and had to leave a message for a call back.    Jazlen Ogarro, LCSW, LCAS Clincal Social Worker  [facility]

## 2021-08-11 NOTE — ED Provider Notes (Signed)
Behavioral Health Urgent Care Medical Screening Exam  Patient Name: Nicholas Carson MRN: [id number] Date of Evaluation: 08/11/21 Chief Complaint:   Diagnosis:  Final diagnoses:  Undifferentiated schizophrenia ([place])  Psychosis, unspecified psychosis type ([place])    History of Present illness: Tierney Hemmerling is a 41 y.o. male.  Patient presents to [facility] behavioral health under involuntary commitment petition, transported by Event organiser.  Patient presents under involuntary commitment petition, petition completed by patient's mother, Marygrace Drought, phone number [phone]. Petition reads:  "Respondent has been diagnosed with diabetes, paranoia and schizophrenia.  He isn't the medications prescribed or attending to personal hygiene.  Respondent is talking to himself and answering.  He punched holes in the walls.  Since the respondent is refusing to take diabetes medication, his sugar is high and he is having trouble seeing."  Patient states "I am trying to get a ride to my PO Box to get my government check."  Patient reports the police picked him up from his home, the home he shares with his grandmother, earlier today, so that he could be evaluated by a doctor.  Patient states "I am ready to go, I want to go home."  Discussed continued evaluation, patient becomes agitated.  He becomes verbally aggressive and yells at provider.  He also becomes physically aggressive, forcefully slamming the door to the assessment room.  Patient is assessed face-to-face by nurse practitioner.  He is seated in assessment area.  Patient is disheveled and visibly soiled, hands appear to have black substance from fingertips to mid forearm.  Patient initially closed/slammed door to assessment area, when door reopened, patient observed seated on floor, in the corner of her room with shirt off.  Neziah is alert and oriented. He presents with anxious mood, labile affect. He denies suicidal and homicidal ideations.   He denies history of suicide attempts, denies history of self-harm.   He has increased speech volume and bizarre behavior. He denies both auditory and visual hallucinations however he appears to be responding to internal stimuli throughout assessment.  Patient appears paranoid, slams assessment room door when staff attempt to approach.  Patient does permit provider to enter room.  He is observed apparently responding to internal stimuli, he whispers phrases with no apparent meaning.  Concern that patient could be experiencing tactile hallucinations as he reaches in air and gestures with arms over head periodically.  Shonta reports he resides with his grandmother, Khalil Forslund, in [place].  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  He endorses alcohol use and marijuana use.  He reports last use of marijuana 2 days ago.  He is unable to recall last alcohol use.  He is a limited historian and is unable to discuss quantity of alcohol typically consumed.  He reports there is no food in the home and he last had something to eat 2 days ago.  Patient also shares that the home has no running water, he is unable to recall last time he bathed himself.  Patient offered support and encouragement.  He is a limited historian at this time.  He gives verbal consent to speak with his mother.  Spoke with his mother,  Marygrace Drought.  Spoke with patient's mother who reports she petitioned patient today because "I thought he was not looking right, his eyes were looking funny, I was concerned that his sugar may be too high or too low."  Per patient's mother patient has been diagnosed with schizophrenia, paranoid  type.  Per patient mother patient has been exhibiting bizarre and aggressive behavior including "punching holes in walls and talking to himself" for at least 6 months.  He has not been compliant with psychotropic medications for approximately 2 years.  She reports  he last was prescribed risperidone by Jobie Quaker, at [facility].  She reports risperidone caused polyps in patient's eyes, ultimately requiring surgery.  She reports he is no longer under the care of outpatient psychiatry related to refusal to attend outpatient visits.    Patient's mother shares that his maternal grandmother has been his primary caretaker and payee for many years.  Per report, there has been no water at grandmother's home for at least 6 months and patient has not bathed in at least 6 months.  Patient's mother reports patient has been hospitalized in an inpatient psychiatric facility at age 87 when he went to [facility].  Patient's mother also shares that patient's biological father had a diagnosis of schizophrenia.  Boykin Peek, patient's mother, shares that patient is followed for primary care needs by Tammy Board at [facility] family medicine.  He has not been compliant with scheduled medications, including those to treat diabetes mellitus, for approximately 6 months minimum.  Patient's mother reports patient typically walks to the store and asks strangers for money, using this money to buy beer.    Psychiatric Specialty Exam  Presentation  General Appearance:Disheveled (visibly soiled)  Eye Contact:Fair  Speech:Pressured  Speech Volume:Increased  Handedness:Right   Mood and Affect  Mood:Labile; Irritable  Affect:Labile; Inappropriate   Thought Process  Thought Processes:Coherent  Descriptions of Associations:Loose  Orientation:Full (Time, Place and Person)  Thought Content:Paranoid Ideation  Diagnosis of Schizophrenia or Schizoaffective disorder in past: Yes  Duration of Psychotic Symptoms: Greater than six months  Hallucinations:None (Patient denies, observed RIS)  Ideas of Reference:Paranoia  Suicidal Thoughts:No  Homicidal Thoughts:No   Sensorium  Memory:Immediate Fair; Recent Poor; Remote  Poor  Judgment:Impaired  Insight:Lacking   Executive Functions  Concentration:Poor  Attention Span:Fair  [place]  Language:Good   Psychomotor Activity  Psychomotor Activity:Restlessness; Increased   Assets  Assets:Housing; Social Support   Sleep  Sleep:-- (patient unable to recall)  336-335 of hours: No data recorded  Nutritional Assessment (For OBS and FBC admissions only) Has the patient had a weight loss or gain of 10 pounds or more in the last 3 months?: -- (patient unable to recall) Has the patient recently lost weight without trying?: 2.0    Physical Exam: Physical Exam Vitals and nursing note reviewed.  Constitutional:      Appearance: He is well-developed.  HENT:     Head: Normocephalic and atraumatic.     Nose: Nose normal.  Cardiovascular:     Rate and Rhythm: Normal rate.  Pulmonary:     Effort: Pulmonary effort is normal.  Musculoskeletal:        General: Normal range of motion.     Cervical back: Normal range of motion.  Skin:    Comments: soiled  Neurological:     Mental Status: He is alert and oriented to person, place, and time.  Psychiatric:        Attention and Perception: Attention normal.        Mood and Affect: Mood normal. Affect is labile and inappropriate.        Speech: Speech is tangential.        Behavior: Behavior is uncooperative, agitated and aggressive.  Thought Content: Thought content is paranoid.        Judgment: Judgment is inappropriate.   Review of Systems  Constitutional: Negative.   HENT: Negative.    Eyes: Negative.   Respiratory: Negative.    Cardiovascular: Negative.   Gastrointestinal: Negative.   Genitourinary: Negative.   Musculoskeletal: Negative.   Neurological: Negative.   Endo/Heme/Allergies: Negative.   Psychiatric/Behavioral:  Positive for substance abuse. The patient is nervous/anxious.   Blood pressure (!) 148/85, pulse 73, temperature 98.5 F (36.9 C),  resp. rate 18, SpO2 100 %. There is no height or weight on file to calculate BMI.  Musculoskeletal: Strength & Muscle Tone: within normal limits Gait & Station: normal Patient leans: N/A   [facility] MSE Discharge Disposition for Follow up and Recommendations: Based on my evaluation the patient appears to have an emergency medical condition for which I recommend the patient be transferred to the emergency department for further evaluation.  Patient reviewed with Dr Serafina Mitchell. Involuntary commitment petition upheld. Patient will be transported to [facility] emergency department, accepting provider, Dr. Dina Rich, for medical clearance. Recommend inpatient psychiatric treatment once patient medically cleared.   Lucky Rathke, FNP 08/11/2021, 1:55 PM

## 2021-08-11 NOTE — ED Triage Notes (Signed)
Per charge pt here for possible DKA. Pt was at the behavioral urgent care. [employer] reports they were called due to pt acting out and that pt was saying that he was going to mess everything up

## 2021-08-11 NOTE — BH Assessment (Addendum)
Comprehensive Clinical Assessment (CCA) Note  08/11/2021 Nicholas Carson [id number]  Disposition:  Per Beatriz Stallion, NP, inpatient is recommended  The patient demonstrates the following risk factors for suicide: Chronic risk factors for suicide include: psychiatric disorder of schizophrenia and substance use disorder. Acute risk factors for suicide include: social withdrawal/isolation. Protective factors for this patient include: positive social support. Considering these factors, the overall suicide risk at this point appears to be moderate. Patient is not appropriate for outpatient follow up due to his current psychosis.     [place] ED from 08/11/2021 in [facility]  PHQ-2 Total Score 0  PHQ-9 Total Score [facility] ED from 08/11/2021 in [place] CATEGORY No Risk         Chief Complaint:  Chief Complaint  Patient presents with   Schizophrenia   IVC   Visit Diagnosis:  F20.9 Schizophrenia, Unspecified   CCA Screening, Triage and Referral (STR)  Patient Reported Information How did you hear about [place]? Legal System  What Is the Reason for Your Visit/Call Today? Patient was brought to the [facility] on IVC petitioned by his family.  Patient is diagnosed with schizophrenia and he has diabetes.  Patient is not taking his medications for either condition, he is not bathing.  Patient appears to be responding to internal stimuli and he is flailing his erms around and not saying anything.  Patient was observed talking to the floor.  Patient denies SI/H.  He [place] disorganized and smells strongly of marijuana.  Patient is emergent.  Due to patient's current level of psychosis, he was not able to provide much information as he was responding to internal stimuli.  Jerry Caras, RN Note: Patient yelling, screaming banging on walls in assessment room. Patient does not want to stay although petitioned by  family. 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 Patient seems to not be appropriate on an open unit with peers due to behavior.   Melissa Hombeck, LCSW Note: CSW went to see patient in the assessment room.  Patient is covered in dirt/filth and reports that he has not taken a shower in a while because the water has been off.  Patient reports that he lives with his grandmother and his grandmother is his payee.  Patient also reports that he has not eaten in days.  Patient is a diabetic.  CSW attempted to make an APS report on the given information and had to leave a message for a call back.   How Long Has This Been Causing You Problems? > than 6 months  What Do You Feel Would Help You the Most Today? Treatment for Depression or other mood problem   Have You Recently Had Any Thoughts About Hurting Yourself? No  Are You Planning to Commit Suicide/Harm Yourself At This time? No   Have you Recently Had Thoughts About [place]? No  Are You Planning to Harm Someone at This Time? No  Explanation: No data recorded  Have You Used Any Alcohol or Drugs in the Past 24 Hours? Yes  How Long Ago Did You Use Drugs or Alcohol? No data recorded What Did You Use and How Much? unable to assess   Do You Currently Have a Therapist/Psychiatrist? No data recorded Name of Therapist/Psychiatrist: No data recorded  Have You Been Recently Discharged From Any Office Practice or Programs? No data recorded Explanation of  Discharge From Practice/Program: No data recorded    CCA Screening Triage Referral Assessment Type of Contact: No data recorded Telemedicine Service Delivery:   Is this Initial or Reassessment? No data recorded Date Telepsych consult ordered in [facility]:  No data recorded Time Telepsych consult ordered in [facility]:  No data recorded Location of Assessment: No data recorded Provider Location: No data recorded  Collateral Involvement: No data recorded  Does  Patient Have a [place]? No data recorded Name and Contact of Legal Guardian: No data recorded If Minor and Not Living with Parent(s), Who has Custody? No data recorded Is CPS involved or ever been involved? No data recorded Is APS involved or ever been involved? No data recorded  Patient Determined To Be At Risk for Harm To Self or Others Based on Review of Patient Reported Information or Presenting Complaint? No data recorded Method: No data recorded Availability of Means: No data recorded Intent: No data recorded Notification Required: No data recorded Additional Information for Danger to Others Potential: No data recorded Additional Comments for Danger to Others Potential: No data recorded Are There Guns or Other Weapons in Your Home? No data recorded Types of Guns/Weapons: No data recorded Are These Weapons Safely Secured?                            No data recorded Who Could Verify You Are Able To Have These Secured: No data recorded Do You Have any Outstanding Charges, Pending Court Dates, Parole/Probation? No data recorded Contacted To Inform of Risk of Harm To Self or Others: No data recorded   Does Patient Present under Involuntary Commitment? No data recorded IVC Papers Initial File Date: No data recorded  [place] of Residence: No data recorded  Patient Currently Receiving the Following Services: No data recorded  Determination of Need: Emergent (2 hours)   Options For Referral: Inpatient Hospitalization     CCA Biopsychosocial Patient Reported Schizophrenia/Schizoaffective Diagnosis in Past: Yes   Strengths: none reported   Mental Health Symptoms Depression:   None   Duration of Depressive symptoms:    Mania:   None   Anxiety:    None   Psychosis:   Hallucinations   Duration of Psychotic symptoms:  Duration of Psychotic Symptoms: Greater than six months   Trauma:   None   Obsessions:   None   Compulsions:   None    Inattention:   None   Hyperactivity/Impulsivity:   None   Oppositional/Defiant Behaviors:   None   Emotional Irregularity:   Potentially harmful impulsivity   Other Mood/Personality Symptoms:  No data recorded   Mental Status Exam Appearance and self-care  Stature:   Tall   Weight:   Thin   Clothing:   Dirty; Disheveled   Grooming:   Neglected   Cosmetic use:   None   Posture/gait:   Bizarre   Motor activity:   Agitated   Sensorium  Attention:   Distractible   Concentration:   Scattered   Orientation:   Person; Place; Situation; Time   Recall/memory:   Normal   Affect and Mood  Affect:   Blunted; Flat   Mood:   Depressed   Relating  Eye contact:   Avoided   Facial expression:   Depressed   Attitude toward examiner:   Cooperative   Thought and Language  Speech flow:  Flight of Ideas; Pressured   Thought content:   Appropriate to  Mood and Circumstances   Preoccupation:   None   Hallucinations:   Auditory   Organization:  No data recorded  [employer] of Knowledge:   Impoverished by (Comment); Fair   Intelligence:   Below average   Abstraction:   Functional   Judgement:   Impaired   Reality Testing:   Distorted   Insight:   None/zero insight   Decision Making:   Impulsive   Social Functioning  Social Maturity:   Impulsive   Social Judgement:   Normal   Stress  Stressors:   Family conflict   Coping Ability:   Programme researcher, broadcasting/film/video Deficits:  No data recorded  Supports:   Family     Religion: Religion/Spirituality Are You A Religious Person?: No (not assessed)  Leisure/Recreation: Leisure / Recreation Do You Have Hobbies?: No  Exercise/Diet: Exercise/Diet Do You Exercise?: No Have You Gained or Lost A Significant Amount of Weight in the Past Six Months?: No Do You Follow a Special Diet?: No Do You Have Any Trouble Sleeping?: No   CCA Employment/Education Employment/Work  Situation: Employment / Work Technical sales engineer: On disability Why is Patient on Disability: schizophrenia How Long has Patient Been on Disability: unknown Patient's Job has Been Impacted by Current Illness: Yes Describe how Patient's Job has Been Impacted: unable to maintain employment due to his mental illness Has Patient ever Been in the [employer]?: No  Education: Education Is Patient Currently Attending School?: No Last Grade Completed:  (unable to assess) Did You Attend College?: No Did You Have An Individualized Education Program (IIEP): No Did You Have Any Difficulty At School?: No Patient's Education Has Been Impacted by Current Illness: No   CCA Family/Childhood History Family and Relationship History: Family history Marital status: Single Does patient have children?: Yes How many children?:  (unknown) How is patient's relationship with their children?: Patient has a daughter listed on his emergency contacts  Childhood History:  Childhood History By whom was/is the patient raised?:  (unable to assess) Did patient suffer any verbal/emotional/physical/sexual abuse as a child?: No Did patient suffer from severe childhood neglect?: No Has patient ever been sexually abused/assaulted/raped as an adolescent or adult?: No Was the patient ever a victim of a crime or a disaster?: No Witnessed domestic violence?: No Has patient been affected by domestic violence as an adult?: No  Child/Adolescent Assessment:     CCA Substance Use Alcohol/Drug Use: Alcohol / Drug Use Pain Medications: see MAR Prescriptions: see MAR Over the Counter: see MAR History of alcohol / drug use?: Yes Longest period of sobriety (when/how long): unknown Negative Consequences of Use:  (exacerbates mental illness) Withdrawal Symptoms: None Substance #1 Name of Substance 1: marijuana 1 - Age of First Use: unknown 1 - Amount (size/oz): unknown 1 - Frequency: "as much as I am able to  get" 1 - Duration: unknown 1 - Last Use / Amount: last use was today 1 - Method of Aquiring: off the street 1- Route of Use: smoke                       ASAM's:  Six Dimensions of Multidimensional Assessment  Dimension 1:  Acute Intoxication and/or Withdrawal Potential:   Dimension 1:  Description of individual's past and current experiences of substance use and withdrawal: Patient has no current withdrawal symptoms  Dimension 2:  Biomedical Conditions and Complications:   Dimension 2:  Description of patient's biomedical conditions and  complications: Patient has  diabetes  which he does not take care of and he becomes medically compromised  Dimension 3:  Emotional, Behavioral, or Cognitive Conditions and Complications:  Dimension 3:  Description of emotional, behavioral, or cognitive conditions and complications: Patient's schizophrenia is exacerbated by his use  Dimension 4:  Readiness to Change:  Dimension 4:  Description of Readiness to Change criteria: Patient does not communicate a desire to stop using marijuana  Dimension 5:  Relapse, Continued use, or Continued Problem Potential:  Dimension 5:  Relapse, continued use, or continued problem potential critiera description: Patient lacks coping mechanisms to prevent relapse  Dimension 6:  Recovery/Living Environment:  Dimension 6:  Recovery/Iiving environment criteria description: Patient tends to acclimate towards people who use as his peer associations  ASAM Severity Score: ASAM's Severity Rating Score: 15  ASAM Recommended Level of Treatment: ASAM Recommended Level of Treatment: Level II Intensive Outpatient Treatment   Substance use Disorder (SUD) Substance Use Disorder (SUD)  Checklist Symptoms of Substance Use: Continued use despite having a persistent/recurrent physical/psychological problem caused/exacerbated by use, Continued use despite persistent or recurrent social, interpersonal problems, caused or exacerbated by use,  Substance(s) often taken in larger amounts or over longer times than was intended, Presence of craving or strong urge to use  Recommendations for Services/Supports/Treatments: Recommendations for Services/Supports/Treatments Recommendations For Services/Supports/Treatments: CD-IOP Intensive Chemical Dependency Program  Discharge Disposition:    DSM5 Diagnoses: Patient Active Problem List   Diagnosis Date Noted   Paranoid schizophrenia ([place]) [id number]   Salicylate intoxication 05/23/2017   Acute kidney injury ([place]) 0000000   Metabolic acidosis due to salicylate 0000000   Leukocytosis 05/23/2017   Hyperglycemia 05/23/2017   Diabetes ([place]) 05/23/2017   Respiratory failure, acute ([place]) 05/23/2017   On mechanically assisted ventilation ([place]) 05/23/2017   Encounter for intubation      Referrals to Alternative Service(s): Referred to Alternative Service(s):   Place:   Date:   Time:    Referred to Alternative Service(s):   Place:   Date:   Time:    Referred to Alternative Service(s):   Place:   Date:   Time:    Referred to Alternative Service(s):   Place:   Date:   Time:     Rivky Clendenning J Matteo Carson, LCAS

## 2021-08-11 NOTE — ED Provider Notes (Signed)
[facility] EMERGENCY DEPARTMENT Provider Note   CSN: [id number] Arrival date & time: 08/11/21  1425     History  Chief Complaint  Patient presents with   Hyperglycemia   IVC    Nicholas Carson is a 41 y.o. male.  Pt presents to the ED today with the police with IVC papers.  Pt initially presented to [facility]. His mother filed the petition.  Pt has a hx of schizophrenia and has diabetes.  Pt has not been taking his meds or attending to his personal hygiene.  Pt has been having paranoid thoughts and hallucinations.  The pt is very agitated and wants to go home.        Home Medications Prior to Admission medications   Medication Sig Start Date End Date Taking? Authorizing Provider  fenofibrate (TRICOR) 145 MG tablet Take 1 tablet (145 mg total) by mouth daily. Patient not taking: Reported on 08/11/2021 05/26/17   Mariel Aloe, MD  JANUMET XR 50-1000 MG TB24 Take 1 tablet by mouth 2 (two) times daily. Patient not taking: Reported on 08/11/2021 01/19/16   [provider]  nicotine (NICODERM CQ - DOSED IN MG/24 HOURS) 14 mg/24hr patch Place 1 patch (14 mg total) onto the skin daily. Patient not taking: Reported on 08/11/2021 05/26/17   Mariel Aloe, MD  RESTASIS 0.05 % ophthalmic emulsion Place 1 drop into the left eye 2 (two) times daily. Patient not taking: Reported on 08/11/2021 05/11/17   [provider]  VENTOLIN HFA 108 (90 Base) MCG/ACT inhaler Inhale 2 puffs into the lungs every 6 (six) hours as needed. Patient not taking: Reported on 08/11/2021 04/13/17   [provider]      Allergies    Patient has no known allergies.    Review of Systems   Review of Systems  Eyes:  Positive for pain, discharge and redness.  Psychiatric/Behavioral:  Positive for agitation and hallucinations. The patient is nervous/anxious.   All other systems reviewed and are negative.  Physical Exam Updated Vital Signs BP 118/71    Pulse 72    Temp (!) 97.1 F (36.2  C)    Resp 18    SpO2 97%  Physical Exam Vitals and nursing note reviewed.  Constitutional:      Appearance: Normal appearance.     Comments: Pt is very disheveled with dirt all over arms and body.  HENT:     Head: Normocephalic and atraumatic.     Right Ear: External ear normal.     Left Ear: External ear normal.     Nose: Nose normal.     Mouth/Throat:     Mouth: Mucous membranes are moist.     Pharynx: Oropharynx is clear.  Eyes:     Conjunctiva/sclera:     Left eye: Left conjunctiva is injected.     Pupils: Pupils are equal, round, and reactive to light.     Left eye: Corneal abrasion and fluorescein uptake present.     Comments: Left eye tearing  Cardiovascular:     Rate and Rhythm: Normal rate and regular rhythm.  Pulmonary:     Effort: Pulmonary effort is normal.     Breath sounds: Normal breath sounds.  Abdominal:     General: Abdomen is flat. Bowel sounds are normal.     Palpations: Abdomen is soft.  Musculoskeletal:        General: Normal range of motion.     Cervical back: Normal range of motion and  neck supple.  Skin:    General: Skin is warm.     Capillary Refill: Capillary refill takes less than 2 seconds.  Neurological:     General: No focal deficit present.     Mental Status: He is alert.     Comments: Pt won't answer orientation questions.  Psychiatric:        Attention and Perception: He perceives auditory hallucinations.        Mood and Affect: Affect is angry.        Behavior: Behavior is agitated, aggressive and combative.        Thought Content: Thought content is paranoid.    ED Results / Procedures / Treatments   Labs (all labs ordered are listed, but only abnormal results are displayed) Labs Reviewed  COMPREHENSIVE METABOLIC PANEL - Abnormal; Notable for the following components:      Result Value   Glucose, Bld 107 (*)    All other components within normal limits  BETA-HYDROXYBUTYRIC ACID - Abnormal; Notable for the following components:    Beta-Hydroxybutyric Acid 0.33 (*)    All other components within normal limits  I-STAT VENOUS BLOOD GAS, ED - Abnormal; Notable for the following components:   pH, Ven 7.436 (*)    pCO2, Ven 37.7 (*)    pO2, Ven 141.0 (*)    Calcium, Ion 1.07 (*)    All other components within normal limits  CBG MONITORING, ED - Abnormal; Notable for the following components:   Glucose-Capillary 115 (*)    All other components within normal limits  RESP PANEL BY RT-PCR (FLU A&B, COVID) ARPGX2  ETHANOL  CBC WITH DIFFERENTIAL/PLATELET  RAPID URINE DRUG SCREEN, HOSP PERFORMED  CBG MONITORING, ED    EKG EKG Interpretation  Date/Time:  Thursday August 11 2021 14:50:42 EST Ventricular Rate:  80 PR Interval:  180 QRS Duration: 96 QT Interval:  376 QTC Calculation: 433 R Axis:   81 Text Interpretation: Normal sinus rhythm Septal infarct , age undetermined Abnormal ECG When compared with ECG of 23-May-2017 10:06, PREVIOUS ECG IS PRESENT Since last tracing rate slower Confirmed by Isla Pence [phone]) on 08/11/2021 2:58:48 PM  Radiology No results found.  Procedures Procedures    Medications Ordered in ED Medications  ciprofloxacin (CILOXAN) 0.3 % ophthalmic solution 2 drop (has no administration in time range)  acetaminophen (TYLENOL) tablet 650 mg (has no administration in time range)  ondansetron (ZOFRAN) tablet 4 mg (has no administration in time range)  alum & mag hydroxide-simeth (MAALOX/MYLANTA) 200-200-20 MG/5ML suspension 30 mL (has no administration in time range)  nicotine (NICODERM CQ - dosed in mg/24 hours) patch 21 mg (21 mg Transdermal Patch Applied 08/11/21 1557)  ziprasidone (GEODON) injection 20 mg (20 mg Intramuscular Given 08/11/21 1437)  fluorescein ophthalmic strip 1 strip (1 strip Left Eye Given by Other 08/11/21 1547)  tetracaine (PONTOCAINE) 0.5 % ophthalmic solution 2 drop (2 drops Left Eye Given by Other 08/11/21 1548)  LORazepam (ATIVAN) injection 2 mg (2 mg Intramuscular  Given 08/11/21 1556)    ED Course/ Medical Decision Making/ A&P                           Medical Decision Making  Due to agitation and possible danger to the staff, he was given geodon 20 mg IM.  This has calmed him significantly.  Pt allowed me to look at his eye and he has a large corneal abrasion.  Cipro eye drops ordered.  Pt is not in DKA.  He is medically clear for TTS eval.    Pt's blood sugar is nl.  He has not filled any diabetic meds since 2021.  I don't think he needs any meds now.  We will do periodic bs checks.  Hgb a1c ordered.         Final Clinical Impression(s) / ED Diagnoses Final diagnoses:  Psychosis, unspecified psychosis type ([place])  Abrasion of left cornea, initial encounter    Rx / DC Orders ED Discharge Orders     None         Isla Pence, MD 08/11/21 1605

## 2021-08-11 NOTE — ED Notes (Signed)
Patients belongings inventoried and placed in locker number 3.

## 2021-08-11 NOTE — Progress Notes (Signed)
APS worker, Denman George, called back and received information for APS report regarding patient basic needs and safety not being met.    Jeslie Lowe, LCSW, [place] Social Worker  [facility]

## 2021-08-11 NOTE — Progress Notes (Signed)
Tried to give report to [place], [facility] CN. She refused to take report. Marisue Ivan said that they are on "full load" and that she will speak with the accepting provider. Marisue Ivan said she will call this Clinical research associate back. Awaiting a call back.

## 2021-08-11 NOTE — Progress Notes (Signed)
[facility] Request for Review for inpatient Behavioral Health Placement.  Per Beatriz Stallion, NP, inpatient treatment is recommended. CSW sent [place], RN secure chat requesting to review pt for potential admission at [facility]. CSW will assist and follow for potential inpatient behavioral health bed offers.     Benjaman Kindler, MSW, [facility] 08/11/2021 10:24 PM

## 2021-08-12 DIAGNOSIS — F29 Unspecified psychosis not due to a substance or known physiological condition: Secondary | ICD-10-CM | POA: Diagnosis not present

## 2021-08-12 LAB — HEMOGLOBIN A1C
Hgb A1c MFr Bld: 6 % — ABNORMAL HIGH (ref 4.8–5.6)
Mean Plasma Glucose: 125.5 mg/dL

## 2021-08-12 LAB — CBG MONITORING, ED
Glucose-Capillary: 115 mg/dL — ABNORMAL HIGH (ref 70–99)
Glucose-Capillary: 118 mg/dL — ABNORMAL HIGH (ref 70–99)

## 2021-08-12 MED ORDER — HALOPERIDOL LACTATE 5 MG/ML IJ SOLN
5.0000 mg | Freq: Once | INTRAMUSCULAR | Status: AC
  Administered 2021-08-12: 5 mg via INTRAMUSCULAR
  Filled 2021-08-12: qty 1

## 2021-08-12 MED ORDER — OLANZAPINE 5 MG PO TBDP
5.0000 mg | ORAL_TABLET | Freq: Every day | ORAL | Status: DC
  Administered 2021-08-12: 5 mg via ORAL
  Filled 2021-08-12: qty 1

## 2021-08-12 MED ORDER — LORAZEPAM 2 MG/ML IJ SOLN
2.0000 mg | Freq: Once | INTRAMUSCULAR | Status: AC
  Administered 2021-08-12: 2 mg via INTRAMUSCULAR
  Filled 2021-08-12: qty 1

## 2021-08-12 NOTE — ED Notes (Signed)
This RN called sheriff for transport, they stated it would be "later today" but they have several transports already.

## 2021-08-12 NOTE — ED Notes (Signed)
Pt discharged to [facility] with sheriff transport. Sheriff has all of patient's belongings, and Terrie at [facility] received report.

## 2021-08-12 NOTE — ED Provider Notes (Signed)
°  Physical Exam  BP 130/85 (BP Location: Right Arm)    Pulse 80    Temp 98 F (36.7 C) (Oral)    Resp 16    SpO2 99%   Physical Exam  Procedures  Procedures  ED Course / MDM    Medical Decision Making  Transferred for inpt psychiatric care.        Gareth Morgan, MD 08/12/21 2222

## 2021-08-12 NOTE — ED Notes (Signed)
Sheriff arrives for transport

## 2021-08-12 NOTE — ED Notes (Signed)
Pt sitting up in bed talking to himself and smacking himself in the bed. Provided ordered medication, crackers and cheese, and diet cola at this time. Made provider aware

## 2021-08-12 NOTE — Progress Notes (Signed)
Inpatient Behavioral Health Placement  Pt meets inpatient criteria per Doran Heater, NP. There are no appropriate bed at [facility] per Fransico Michael, RN. Referral was sent to the following facilities;   Destination Service Provider Address Phone Fax  [facility]  [address], [place] [address] [phone]  [facility]  [address] [place] [address] [phone]  [facility] [address]  [address], [place] [address] [phone]  [facility]  [address] [place], [place] [address] [phone]  [facility] [place] [address] [phone]  [facility]  [address] [place] [address] [phone]  [facility]  [address] [place] [address] [phone]  [facility]  [address] [place] [address] [phone]  [facility]  [address] [place] [address] [phone]  [facility]  [address], [place] [address] [phone]  [facility]  [address], [place] [address] [phone]  [facility]  [address] [place] [address] [phone]  [facility]  [address] [place] [address] [phone]  [facility]  [address] [place] [address] [phone]  [facility]  [address] [place] [address] [phone]  [facility]  [address] [place], [place] [address] [phone]  [facility]  [address] [place] [address]       Situation ongoing,  CSW will follow up.   Maryjean Ka, MSW, LCSWA 08/12/2021  @ 12:27 AM

## 2021-08-12 NOTE — ED Notes (Signed)
Pt talking about things that are not dealing with the situation about car keys, girl friend, and to leave. Pt is also using foul language and talking in a loud voice. Provider aware and at bedside at this time

## 2021-08-12 NOTE — ED Notes (Signed)
Discussed pt with EDP [place]. Pt is increasingly agitated and beginning to respond to external stimuli. Discussed concern for increasing agitation. Pending orders. Primary RN Bonita Quin informed.

## 2021-08-12 NOTE — Progress Notes (Signed)
Pt accepted to [facility] 08/12/21 after 8am per Ron.   [facility] accepted after 8am 08/12/21. Dr. Estill Cotta. Bed assignment main campus. Phone number for report [phone], [phone]. Nursing notified via secure chat: Glynis Smiles, RN. CSW requested that EDP be added to change and that nursing coordinate transportation.     Maryjean Ka, MSW, LCSWA 08/12/2021 1:16 AM

## 2022-01-12 ENCOUNTER — Encounter (HOSPITAL_COMMUNITY): Payer: Self-pay | Admitting: Pulmonary Disease

## 2022-01-12 ENCOUNTER — Emergency Department (HOSPITAL_COMMUNITY): Payer: Medicare Other

## 2022-01-12 ENCOUNTER — Encounter (HOSPITAL_COMMUNITY): Payer: Medicare Other

## 2022-01-12 ENCOUNTER — Inpatient Hospital Stay (HOSPITAL_COMMUNITY): Payer: Medicare Other

## 2022-01-12 ENCOUNTER — Other Ambulatory Visit: Payer: Self-pay

## 2022-01-12 ENCOUNTER — Inpatient Hospital Stay (HOSPITAL_COMMUNITY)
Admission: EM | Admit: 2022-01-12 | Discharge: 2022-02-04 | DRG: 100 | Disposition: E | Payer: Medicare Other | Attending: Internal Medicine | Admitting: Internal Medicine

## 2022-01-12 DIAGNOSIS — F10288 Alcohol dependence with other alcohol-induced disorder: Secondary | ICD-10-CM | POA: Diagnosis not present

## 2022-01-12 DIAGNOSIS — M6282 Rhabdomyolysis: Secondary | ICD-10-CM | POA: Diagnosis present

## 2022-01-12 DIAGNOSIS — S01552A Open bite of oral cavity, initial encounter: Secondary | ICD-10-CM

## 2022-01-12 DIAGNOSIS — Z515 Encounter for palliative care: Secondary | ICD-10-CM | POA: Diagnosis not present

## 2022-01-12 DIAGNOSIS — F10239 Alcohol dependence with withdrawal, unspecified: Secondary | ICD-10-CM | POA: Diagnosis present

## 2022-01-12 DIAGNOSIS — I471 Supraventricular tachycardia: Secondary | ICD-10-CM | POA: Diagnosis present

## 2022-01-12 DIAGNOSIS — I469 Cardiac arrest, cause unspecified: Secondary | ICD-10-CM | POA: Diagnosis present

## 2022-01-12 DIAGNOSIS — F1990 Other psychoactive substance use, unspecified, uncomplicated: Secondary | ICD-10-CM | POA: Diagnosis not present

## 2022-01-12 DIAGNOSIS — F1210 Cannabis abuse, uncomplicated: Secondary | ICD-10-CM | POA: Diagnosis present

## 2022-01-12 DIAGNOSIS — G40501 Epileptic seizures related to external causes, not intractable, with status epilepticus: Secondary | ICD-10-CM | POA: Diagnosis present

## 2022-01-12 DIAGNOSIS — Z66 Do not resuscitate: Secondary | ICD-10-CM | POA: Diagnosis not present

## 2022-01-12 DIAGNOSIS — J690 Pneumonitis due to inhalation of food and vomit: Secondary | ICD-10-CM | POA: Diagnosis present

## 2022-01-12 DIAGNOSIS — J45909 Unspecified asthma, uncomplicated: Secondary | ICD-10-CM | POA: Diagnosis present

## 2022-01-12 DIAGNOSIS — F1010 Alcohol abuse, uncomplicated: Secondary | ICD-10-CM | POA: Diagnosis not present

## 2022-01-12 DIAGNOSIS — G9341 Metabolic encephalopathy: Secondary | ICD-10-CM | POA: Diagnosis present

## 2022-01-12 DIAGNOSIS — R569 Unspecified convulsions: Secondary | ICD-10-CM | POA: Diagnosis not present

## 2022-01-12 DIAGNOSIS — Z91199 Patient's noncompliance with other medical treatment and regimen due to unspecified reason: Secondary | ICD-10-CM | POA: Diagnosis not present

## 2022-01-12 DIAGNOSIS — Z91148 Patient's other noncompliance with medication regimen for other reason: Secondary | ICD-10-CM

## 2022-01-12 DIAGNOSIS — I10 Essential (primary) hypertension: Secondary | ICD-10-CM | POA: Diagnosis present

## 2022-01-12 DIAGNOSIS — E872 Acidosis, unspecified: Secondary | ICD-10-CM | POA: Diagnosis present

## 2022-01-12 DIAGNOSIS — Z79899 Other long term (current) drug therapy: Secondary | ICD-10-CM

## 2022-01-12 DIAGNOSIS — G40901 Epilepsy, unspecified, not intractable, with status epilepticus: Secondary | ICD-10-CM | POA: Diagnosis not present

## 2022-01-12 DIAGNOSIS — R042 Hemoptysis: Secondary | ICD-10-CM | POA: Diagnosis present

## 2022-01-12 DIAGNOSIS — E871 Hypo-osmolality and hyponatremia: Secondary | ICD-10-CM | POA: Diagnosis present

## 2022-01-12 DIAGNOSIS — E781 Pure hyperglyceridemia: Secondary | ICD-10-CM | POA: Diagnosis not present

## 2022-01-12 DIAGNOSIS — Y900 Blood alcohol level of less than 20 mg/100 ml: Secondary | ICD-10-CM | POA: Diagnosis present

## 2022-01-12 DIAGNOSIS — N179 Acute kidney failure, unspecified: Secondary | ICD-10-CM | POA: Diagnosis present

## 2022-01-12 DIAGNOSIS — R6511 Systemic inflammatory response syndrome (SIRS) of non-infectious origin with acute organ dysfunction: Secondary | ICD-10-CM | POA: Diagnosis present

## 2022-01-12 DIAGNOSIS — F200 Paranoid schizophrenia: Secondary | ICD-10-CM | POA: Diagnosis present

## 2022-01-12 DIAGNOSIS — F17210 Nicotine dependence, cigarettes, uncomplicated: Secondary | ICD-10-CM | POA: Diagnosis present

## 2022-01-12 DIAGNOSIS — G931 Anoxic brain damage, not elsewhere classified: Secondary | ICD-10-CM

## 2022-01-12 DIAGNOSIS — R7401 Elevation of levels of liver transaminase levels: Secondary | ICD-10-CM | POA: Diagnosis present

## 2022-01-12 DIAGNOSIS — J9601 Acute respiratory failure with hypoxia: Secondary | ICD-10-CM | POA: Diagnosis present

## 2022-01-12 DIAGNOSIS — Z9911 Dependence on respirator [ventilator] status: Secondary | ICD-10-CM | POA: Diagnosis not present

## 2022-01-12 DIAGNOSIS — E7220 Disorder of urea cycle metabolism, unspecified: Secondary | ICD-10-CM | POA: Diagnosis present

## 2022-01-12 DIAGNOSIS — S01512A Laceration without foreign body of oral cavity, initial encounter: Secondary | ICD-10-CM | POA: Diagnosis present

## 2022-01-12 DIAGNOSIS — Z7189 Other specified counseling: Secondary | ICD-10-CM | POA: Diagnosis not present

## 2022-01-12 DIAGNOSIS — F1020 Alcohol dependence, uncomplicated: Secondary | ICD-10-CM | POA: Diagnosis present

## 2022-01-12 DIAGNOSIS — E8770 Fluid overload, unspecified: Secondary | ICD-10-CM | POA: Diagnosis not present

## 2022-01-12 DIAGNOSIS — E119 Type 2 diabetes mellitus without complications: Secondary | ICD-10-CM | POA: Diagnosis present

## 2022-01-12 DIAGNOSIS — Z720 Tobacco use: Secondary | ICD-10-CM

## 2022-01-12 DIAGNOSIS — R61 Generalized hyperhidrosis: Secondary | ICD-10-CM | POA: Diagnosis present

## 2022-01-12 DIAGNOSIS — R778 Other specified abnormalities of plasma proteins: Secondary | ICD-10-CM | POA: Diagnosis present

## 2022-01-12 DIAGNOSIS — R451 Restlessness and agitation: Secondary | ICD-10-CM | POA: Diagnosis not present

## 2022-01-12 DIAGNOSIS — F29 Unspecified psychosis not due to a substance or known physiological condition: Secondary | ICD-10-CM | POA: Diagnosis present

## 2022-01-12 DIAGNOSIS — R0609 Other forms of dyspnea: Secondary | ICD-10-CM | POA: Diagnosis not present

## 2022-01-12 HISTORY — DX: Pure hyperglyceridemia: E78.1

## 2022-01-12 HISTORY — DX: Tobacco use: Z72.0

## 2022-01-12 LAB — COMPREHENSIVE METABOLIC PANEL
ALT: 46 U/L — ABNORMAL HIGH (ref 0–44)
AST: 109 U/L — ABNORMAL HIGH (ref 15–41)
Albumin: 3 g/dL — ABNORMAL LOW (ref 3.5–5.0)
Alkaline Phosphatase: 61 U/L (ref 38–126)
BUN: 10 mg/dL (ref 6–20)
CO2: <7 mmol/L — ABNORMAL LOW (ref 22–32)
Calcium: 7.5 mg/dL — ABNORMAL LOW (ref 8.9–10.3)
Chloride: 85 mmol/L — ABNORMAL LOW (ref 98–111)
Creatinine, Ser: 1.3 mg/dL — ABNORMAL HIGH (ref 0.61–1.24)
GFR, Estimated: >60 mL/min (ref >60)
Glucose, Bld: 232 mg/dL — ABNORMAL HIGH (ref 70–99)
Potassium: 4.2 mmol/L (ref 3.5–5.1)
Sodium: 119 mmol/L — CL (ref 135–145)
Total Bilirubin: 0.8 mg/dL (ref 0.3–1.2)
Total Protein: 5.8 g/dL — ABNORMAL LOW (ref 6.5–8.1)

## 2022-01-12 LAB — POCT I-STAT EG7
Acid-base deficit: 6 mmol/L — ABNORMAL HIGH (ref 0.0–2.0)
Bicarbonate: 19 mmol/L — ABNORMAL LOW (ref 20.0–28.0)
Calcium, Ion: 1.05 mmol/L — ABNORMAL LOW (ref 1.15–1.40)
HCT: 48 % (ref 39.0–52.0)
Hemoglobin: 16.3 g/dL (ref 13.0–17.0)
O2 Saturation: 69 %
Potassium: 3.8 mmol/L (ref 3.5–5.1)
Sodium: 123 mmol/L — ABNORMAL LOW (ref 135–145)
TCO2: 20 mmol/L — ABNORMAL LOW (ref 22–32)
pCO2, Ven: 36.8 mmHg — ABNORMAL LOW (ref 44–60)
pH, Ven: 7.322 (ref 7.25–7.43)
pO2, Ven: 39 mmHg (ref 32–45)

## 2022-01-12 LAB — CBC WITH DIFFERENTIAL/PLATELET
Abs Immature Granulocytes: 1.12 10*3/uL — ABNORMAL HIGH (ref 0.00–0.07)
Basophils Absolute: 0.1 10*3/uL (ref 0.0–0.1)
Basophils Relative: 0 %
Eosinophils Absolute: 0 10*3/uL (ref 0.0–0.5)
Eosinophils Relative: 0 %
HCT: 47 % (ref 39.0–52.0)
Hemoglobin: 15.1 g/dL (ref 13.0–17.0)
Immature Granulocytes: 5 %
Lymphocytes Relative: 16 %
Lymphs Abs: 3.8 10*3/uL (ref 0.7–4.0)
MCH: 29.8 pg (ref 26.0–34.0)
MCHC: 32.1 g/dL (ref 30.0–36.0)
MCV: 92.9 fL (ref 80.0–100.0)
Monocytes Absolute: 0.8 10*3/uL (ref 0.1–1.0)
Monocytes Relative: 4 %
Neutro Abs: 17.1 10*3/uL — ABNORMAL HIGH (ref 1.7–7.7)
Neutrophils Relative %: 75 %
Platelets: 223 10*3/uL (ref 150–400)
RBC: 5.06 MIL/uL (ref 4.22–5.81)
RDW: 12.6 % (ref 11.5–15.5)
WBC: 22.9 10*3/uL — ABNORMAL HIGH (ref 4.0–10.5)
nRBC: 0 % (ref 0.0–0.2)

## 2022-01-12 LAB — URINALYSIS, ROUTINE W REFLEX MICROSCOPIC
Bilirubin Urine: NEGATIVE
Glucose, UA: NEGATIVE mg/dL
Ketones, ur: 5 mg/dL — AB
Leukocytes,Ua: NEGATIVE
Nitrite: NEGATIVE
Protein, ur: NEGATIVE mg/dL
Specific Gravity, Urine: 1.008 (ref 1.005–1.030)
pH: 5 (ref 5.0–8.0)

## 2022-01-12 LAB — CK: Total CK: 7232 U/L — ABNORMAL HIGH (ref 49–397)

## 2022-01-12 LAB — RAPID URINE DRUG SCREEN, HOSP PERFORMED
Amphetamines: NOT DETECTED
Barbiturates: NOT DETECTED
Benzodiazepines: POSITIVE — AB
Cocaine: NOT DETECTED
Opiates: NOT DETECTED
Tetrahydrocannabinol: POSITIVE — AB

## 2022-01-12 LAB — CBG MONITORING, ED
Glucose-Capillary: 196 mg/dL — ABNORMAL HIGH (ref 70–99)
Glucose-Capillary: 208 mg/dL — ABNORMAL HIGH (ref 70–99)
Glucose-Capillary: 64 mg/dL — ABNORMAL LOW (ref 70–99)

## 2022-01-12 LAB — I-STAT ARTERIAL BLOOD GAS, ED
Acid-base deficit: 10 mmol/L — ABNORMAL HIGH (ref 0.0–2.0)
Bicarbonate: 16.8 mmol/L — ABNORMAL LOW (ref 20.0–28.0)
Calcium, Ion: 1.04 mmol/L — ABNORMAL LOW (ref 1.15–1.40)
HCT: 48 % (ref 39.0–52.0)
Hemoglobin: 16.3 g/dL (ref 13.0–17.0)
O2 Saturation: 100 %
Potassium: 4.3 mmol/L (ref 3.5–5.1)
Sodium: 116 mmol/L — CL (ref 135–145)
TCO2: 18 mmol/L — ABNORMAL LOW (ref 22–32)
pCO2 arterial: 38.3 mmHg (ref 32–48)
pH, Arterial: 7.249 — ABNORMAL LOW (ref 7.35–7.45)
pO2, Arterial: 478 mmHg — ABNORMAL HIGH (ref 83–108)

## 2022-01-12 LAB — HEMOGLOBIN A1C
Hgb A1c MFr Bld: 6.5 % — ABNORMAL HIGH (ref 4.8–5.6)
Mean Plasma Glucose: 139.85 mg/dL

## 2022-01-12 LAB — TSH: TSH: 1.122 u[IU]/mL (ref 0.350–4.500)

## 2022-01-12 LAB — I-STAT VENOUS BLOOD GAS, ED
Acid-base deficit: 28 mmol/L — ABNORMAL HIGH (ref 0.0–2.0)
Bicarbonate: 5.4 mmol/L — ABNORMAL LOW (ref 20.0–28.0)
Calcium, Ion: 0.95 mmol/L — ABNORMAL LOW (ref 1.15–1.40)
HCT: 50 % (ref 39.0–52.0)
Hemoglobin: 17 g/dL (ref 13.0–17.0)
O2 Saturation: 98 %
Potassium: 4.2 mmol/L (ref 3.5–5.1)
Sodium: 117 mmol/L — CL (ref 135–145)
TCO2: 6 mmol/L — ABNORMAL LOW (ref 22–32)
pCO2, Ven: 29.6 mmHg — ABNORMAL LOW (ref 44–60)
pH, Ven: 6.87 — CL (ref 7.25–7.43)
pO2, Ven: 178 mmHg — ABNORMAL HIGH (ref 32–45)

## 2022-01-12 LAB — SODIUM: Sodium: 118 mmol/L — CL (ref 135–145)

## 2022-01-12 LAB — MAGNESIUM: Magnesium: 2.2 mg/dL (ref 1.7–2.4)

## 2022-01-12 LAB — HIV ANTIBODY (ROUTINE TESTING W REFLEX): HIV Screen 4th Generation wRfx: NONREACTIVE

## 2022-01-12 LAB — GLUCOSE, CAPILLARY: Glucose-Capillary: 175 mg/dL — ABNORMAL HIGH (ref 70–99)

## 2022-01-12 LAB — TROPONIN I (HIGH SENSITIVITY)
Troponin I (High Sensitivity): 103 ng/L (ref <18)
Troponin I (High Sensitivity): 635 ng/L (ref <18)

## 2022-01-12 LAB — ETHANOL: Alcohol, Ethyl (B): 10 mg/dL — ABNORMAL HIGH (ref <10)

## 2022-01-12 MED ORDER — SODIUM CHLORIDE 0.9 % IV BOLUS
1000.0000 mL | Freq: Once | INTRAVENOUS | Status: AC
  Administered 2022-01-12: 1000 mL via INTRAVENOUS

## 2022-01-12 MED ORDER — MIDAZOLAM HCL 2 MG/2ML IJ SOLN
2.0000 mg | INTRAMUSCULAR | Status: DC | PRN
  Administered 2022-01-12 – 2022-01-25 (×17): 2 mg via INTRAVENOUS
  Filled 2022-01-12 (×22): qty 2

## 2022-01-12 MED ORDER — LORAZEPAM 2 MG/ML IJ SOLN
INTRAMUSCULAR | Status: AC
  Administered 2022-01-12: 2 mg via INTRAMUSCULAR
  Filled 2022-01-12: qty 1

## 2022-01-12 MED ORDER — LACTATED RINGERS IV BOLUS: 1000.0000 mL | Freq: Once | INTRAVENOUS | Status: DC

## 2022-01-12 MED ORDER — INSULIN ASPART 100 UNIT/ML IJ SOLN
0.0000 [IU] | INTRAMUSCULAR | Status: DC
  Administered 2022-01-12 – 2022-01-13 (×7): 2 [IU] via SUBCUTANEOUS
  Administered 2022-01-13: 1 [IU] via SUBCUTANEOUS
  Administered 2022-01-14 (×2): 2 [IU] via SUBCUTANEOUS
  Administered 2022-01-14 (×2): 3 [IU] via SUBCUTANEOUS
  Administered 2022-01-14 (×2): 2 [IU] via SUBCUTANEOUS
  Administered 2022-01-15: 3 [IU] via SUBCUTANEOUS
  Administered 2022-01-15: 2 [IU] via SUBCUTANEOUS
  Administered 2022-01-15: 3 [IU] via SUBCUTANEOUS
  Administered 2022-01-16 (×6): 2 [IU] via SUBCUTANEOUS
  Administered 2022-01-17: 1 [IU] via SUBCUTANEOUS
  Administered 2022-01-17: 3 [IU] via SUBCUTANEOUS
  Administered 2022-01-17 (×4): 2 [IU] via SUBCUTANEOUS
  Administered 2022-01-18 (×2): 1 [IU] via SUBCUTANEOUS
  Administered 2022-01-18: 2 [IU] via SUBCUTANEOUS
  Administered 2022-01-18: 1 [IU] via SUBCUTANEOUS
  Administered 2022-01-18: 2 [IU] via SUBCUTANEOUS
  Administered 2022-01-19: 1 [IU] via SUBCUTANEOUS
  Administered 2022-01-19 (×3): 2 [IU] via SUBCUTANEOUS
  Administered 2022-01-19: 1 [IU] via SUBCUTANEOUS
  Administered 2022-01-19: 2 [IU] via SUBCUTANEOUS
  Administered 2022-01-20: 1 [IU] via SUBCUTANEOUS
  Administered 2022-01-20: 2 [IU] via SUBCUTANEOUS
  Administered 2022-01-20 – 2022-01-21 (×5): 1 [IU] via SUBCUTANEOUS
  Administered 2022-01-21: 2 [IU] via SUBCUTANEOUS
  Administered 2022-01-22 – 2022-01-23 (×4): 1 [IU] via SUBCUTANEOUS
  Administered 2022-01-23: 2 [IU] via SUBCUTANEOUS
  Administered 2022-01-23 – 2022-01-24 (×4): 1 [IU] via SUBCUTANEOUS
  Administered 2022-01-24: 2 [IU] via SUBCUTANEOUS
  Administered 2022-01-24 (×3): 1 [IU] via SUBCUTANEOUS

## 2022-01-12 MED ORDER — ALBUTEROL SULFATE (2.5 MG/3ML) 0.083% IN NEBU
2.5000 mg | INHALATION_SOLUTION | RESPIRATORY_TRACT | Status: DC | PRN
Start: 2022-01-12 — End: 2022-01-25

## 2022-01-12 MED ORDER — THIAMINE HCL 100 MG/ML IJ SOLN
100.0000 mg | INTRAMUSCULAR | Status: AC
  Administered 2022-01-15 – 2022-01-23 (×9): 100 mg via INTRAVENOUS
  Filled 2022-01-12 (×9): qty 2

## 2022-01-12 MED ORDER — FENTANYL BOLUS VIA INFUSION
50.0000 ug | INTRAVENOUS | Status: DC | PRN
  Administered 2022-01-13: 50 ug via INTRAVENOUS
  Administered 2022-01-13 (×2): 100 ug via INTRAVENOUS
  Administered 2022-01-14: 25 ug via INTRAVENOUS
  Administered 2022-01-14 – 2022-01-15 (×3): 100 ug via INTRAVENOUS
  Administered 2022-01-17 – 2022-01-18 (×14): 50 ug via INTRAVENOUS
  Administered 2022-01-20 – 2022-01-22 (×4): 100 ug via INTRAVENOUS

## 2022-01-12 MED ORDER — SODIUM BICARBONATE 8.4 % IV SOLN
50.0000 meq | Freq: Once | INTRAVENOUS | Status: AC
Start: 2022-01-12 — End: 2022-01-12
  Administered 2022-01-12: 50 meq via INTRAVENOUS

## 2022-01-12 MED ORDER — LORAZEPAM 2 MG/ML IJ SOLN
INTRAMUSCULAR | Status: AC
  Administered 2022-01-12: 2 mg via INTRAMUSCULAR
  Filled 2022-01-12: qty 3

## 2022-01-12 MED ORDER — FENTANYL 2500MCG IN NS 250ML (10MCG/ML) PREMIX INFUSION
50.0000 ug/h | INTRAVENOUS | Status: DC
  Administered 2022-01-13: 50 ug/h via INTRAVENOUS
  Administered 2022-01-14: 100 ug/h via INTRAVENOUS
  Administered 2022-01-14: 150 ug/h via INTRAVENOUS
  Administered 2022-01-16: 125 ug/h via INTRAVENOUS
  Administered 2022-01-17 – 2022-01-18 (×2): 100 ug/h via INTRAVENOUS
  Administered 2022-01-19 – 2022-01-23 (×7): 150 ug/h via INTRAVENOUS
  Administered 2022-01-24 (×2): 175 ug/h via INTRAVENOUS
  Administered 2022-01-25: 150 ug/h via INTRAVENOUS
  Filled 2022-01-12 (×17): qty 250

## 2022-01-12 MED ORDER — MIDAZOLAM HCL 2 MG/2ML IJ SOLN
INTRAMUSCULAR | Status: AC
  Administered 2022-01-12: 2 mg via INTRAVENOUS
  Filled 2022-01-12: qty 2

## 2022-01-12 MED ORDER — ADULT MULTIVITAMIN W/MINERALS CH: 1.0000 | ORAL_TABLET | Freq: Every day | ORAL | Status: DC

## 2022-01-12 MED ORDER — SODIUM CHLORIDE 0.9 % IV SOLN
3.0000 g | Freq: Four times a day (QID) | INTRAVENOUS | Status: AC
  Administered 2022-01-12 – 2022-01-17 (×18): 3 g via INTRAVENOUS
  Filled 2022-01-12 (×18): qty 8

## 2022-01-12 MED ORDER — LORAZEPAM 2 MG/ML IJ SOLN: 2.0000 mg | Freq: Once | INTRAMUSCULAR | Status: DC

## 2022-01-12 MED ORDER — POLYETHYLENE GLYCOL 3350 17 G PO PACK: 17.0000 g | PACK | Freq: Every day | ORAL | Status: DC

## 2022-01-12 MED ORDER — SODIUM CHLORIDE 0.9 % IV SOLN
2000.0000 mg | Freq: Once | INTRAVENOUS | Status: AC
  Administered 2022-01-12: 2000 mg via INTRAVENOUS
  Filled 2022-01-12: qty 20

## 2022-01-12 MED ORDER — POLYETHYLENE GLYCOL 3350 17 G PO PACK: 17.0000 g | PACK | Freq: Every day | ORAL | Status: DC | PRN

## 2022-01-12 MED ORDER — IPRATROPIUM-ALBUTEROL 0.5-2.5 (3) MG/3ML IN SOLN
3.0000 mL | Freq: Four times a day (QID) | RESPIRATORY_TRACT | Status: DC
  Administered 2022-01-12 – 2022-01-25 (×50): 3 mL via RESPIRATORY_TRACT
  Filled 2022-01-12 (×50): qty 3

## 2022-01-12 MED ORDER — IOHEXOL 350 MG/ML SOLN
60.0000 mL | Freq: Once | INTRAVENOUS | Status: AC | PRN
Start: 2022-01-12 — End: 2022-01-12
  Administered 2022-01-12: 60 mL via INTRAVENOUS

## 2022-01-12 MED ORDER — SODIUM CHLORIDE 0.9 % IV SOLN
Freq: Once | INTRAVENOUS | Status: AC
Start: 2022-01-12 — End: 2022-01-12

## 2022-01-12 MED ORDER — FOLIC ACID 1 MG PO TABS: 1.0000 mg | ORAL_TABLET | Freq: Every day | ORAL | Status: DC

## 2022-01-12 MED ORDER — DOCUSATE SODIUM 50 MG/5ML PO LIQD: 100.0000 mg | Freq: Two times a day (BID) | ORAL | Status: DC

## 2022-01-12 MED ORDER — PROPOFOL 1000 MG/100ML IV EMUL
5.0000 ug/kg/min | INTRAVENOUS | Status: DC
  Administered 2022-01-12: 15 ug/kg/min via INTRAVENOUS
  Administered 2022-01-13 (×3): 55 ug/kg/min via INTRAVENOUS
  Administered 2022-01-13 – 2022-01-14 (×2): 50 ug/kg/min via INTRAVENOUS
  Filled 2022-01-12 (×4): qty 100
  Filled 2022-01-12: qty 200
  Filled 2022-01-12 (×4): qty 100

## 2022-01-12 MED ORDER — EPINEPHRINE 1 MG/10ML IJ SOSY
PREFILLED_SYRINGE | INTRAMUSCULAR | Status: AC | PRN
  Administered 2022-01-12 (×2): 1 mg via INTRAVENOUS

## 2022-01-12 MED ORDER — LORAZEPAM 2 MG/ML IJ SOLN
2.0000 mg | Freq: Once | INTRAMUSCULAR | Status: AC
  Administered 2022-01-12: 2 mg via INTRAVENOUS
  Filled 2022-01-12: qty 1

## 2022-01-12 MED ORDER — MIDAZOLAM HCL 2 MG/2ML IJ SOLN
2.0000 mg | INTRAMUSCULAR | Status: DC | PRN
  Administered 2022-01-12 – 2022-01-25 (×2): 2 mg via INTRAVENOUS
  Filled 2022-01-12 (×3): qty 2

## 2022-01-12 MED ORDER — DOCUSATE SODIUM 100 MG PO CAPS: 100.0000 mg | ORAL_CAPSULE | Freq: Two times a day (BID) | ORAL | Status: DC | PRN

## 2022-01-12 MED ORDER — CALCIUM GLUCONATE-NACL 1-0.675 GM/50ML-% IV SOLN
1.0000 g | Freq: Once | INTRAVENOUS | Status: AC
Start: 2022-01-13 — End: 2022-01-13
  Administered 2022-01-12: 1000 mg via INTRAVENOUS
  Filled 2022-01-12: qty 50

## 2022-01-12 MED ORDER — MIDAZOLAM HCL 2 MG/2ML IJ SOLN
1.0000 mg | INTRAMUSCULAR | Status: DC | PRN
  Administered 2022-01-12 – 2022-01-22 (×8): 2 mg via INTRAVENOUS
  Filled 2022-01-12 (×3): qty 2

## 2022-01-12 MED ORDER — PANTOPRAZOLE 2 MG/ML SUSPENSION: 40.0000 mg | Freq: Every day | ORAL | Status: DC

## 2022-01-12 MED ORDER — FENTANYL CITRATE PF 50 MCG/ML IJ SOSY: 50.0000 ug | PREFILLED_SYRINGE | Freq: Once | INTRAMUSCULAR | Status: DC

## 2022-01-12 MED ORDER — THIAMINE HCL 100 MG/ML IJ SOLN
500.0000 mg | Freq: Three times a day (TID) | INTRAVENOUS | Status: AC
  Administered 2022-01-12 – 2022-01-15 (×9): 500 mg via INTRAVENOUS
  Filled 2022-01-12 (×9): qty 5

## 2022-01-12 NOTE — ED Notes (Signed)
Pulse check at this time. Patient found to have no pulse, compressions resumed.

## 2022-01-12 NOTE — Progress Notes (Signed)
   Referring Provider Bartholome Bill, MD [place],   [address]   CC: tongue bite with seizure   Nicholas Carson is an 41 y.o. male.  HPI: 41 year old bit tongue during seizure and had active bleeding.  At the time of my exam the patient was actively clenching his teeth and shaking his head but bleeding had slowed down or stopped.    No Known Allergies  @MEDCOMM @   Past Medical History:  Diagnosis Date   Diabetes mellitus without complication ([place])    Hypertriglyceridemia 01/21/2022   Paranoid schizophrenia ([place]) 05/26/2017   Tobacco abuse 01/07/2022    No past surgical history on file.  No family history on file.  Social History   Social History Narrative   Not on file     Review of Systems General: Denies fevers, chills, weight loss CV: Denies chest pain, shortness of breath, palpitations   Physical Exam    01/07/2022    9:39 PM 01/15/2022    9:22 PM 01/13/2022    9:20 PM  Vitals with BMI  Systolic [id number] 96 90  Diastolic [id number] 69 36  Pulse 145 148 148    General:  No acute distress,  Alert and oriented, Non-Toxic, Normal speech and affect Heent:  intubated, clenching teeth and shaking head, no active bleeding noted.   Assessment/Plan Significant tongue bite.  Bleeding has slowed down or stopped.  If bleeds again then please pack with trauma guaze.  Will reevaluate for repair when patient is less agitated.    Lennice Sites 01/06/2022, 10:38 PM

## 2022-01-12 NOTE — ED Notes (Signed)
Pulse check at this time. No pulses were palpated at this time. Compressions resumed.

## 2022-01-12 NOTE — Progress Notes (Signed)
Chaplain responded to call from ED bridge for support.  Medical team working on the patient.  Connected with RN and MD and went and spoke to family.  Family requested prayer and shared about what brought them to the ED.  Allowing space for family to share concerns but leaning into their faith for strength.  Was able to facilitate 2 family members to go bedside for a short visit.  Family upset  trying to process what is going on.  Chaplain available as needed for further support. Chaplain Agustin Cree, Mdiv.    01/17/2022 1948  Clinical Encounter Type  Visited With Patient;Family;Health care provider  Visit Type Initial;ED;Critical Care  Referral From Nurse  Consult/Referral To Chaplain  Spiritual Encounters  Spiritual Needs Prayer;Emotional

## 2022-01-12 NOTE — ED Notes (Signed)
Access obtained at this time. 1st round of Epi was pushed immediately.

## 2022-01-12 NOTE — ED Triage Notes (Addendum)
PT BIB EMS after being unresponsive w/ family. Family states that earlier today, pt had been drinking 2 40 oz beers and had passed out. Upon EMS arrival, EMS witnessed pt having a full body seizure that lasted approximately 1 minute. EMS gave IM  5mg  Midazolam. During seizure pt had bit his tongue, with EMS suctioning on route.

## 2022-01-12 NOTE — ED Notes (Addendum)
Upon arrival patient was noted to be very agitated and combative. EDP at bedside gave verbal order to give 2mg  of Ativan IM.

## 2022-01-12 NOTE — Progress Notes (Signed)
Pt transported from ED1 to 2H20 w/o complications. RT will cont to monitor as needed.

## 2022-01-12 NOTE — Consult Note (Signed)
Neurology Consultation Reason for Consult: Seizures Requesting Physician: Teressa Lower  CC: seizures  History is obtained from: Chart review   HPI: Nicholas Carson is a 41 y.o. male with a past medical history of paranoid schizophrenia, medication nonadherence, diabetes, asthma, alcohol abuse (at least 80 ounces of beer per day), marijuana use, smoking 1 pack/day, presenting with seizures.  Per family no known seizure history, but today he seemed in his normal state of health at 7 AM in the morning.  The family had left the home for some errands and he called them several times while they were out to ask when they were coming home etc.  He still seemed fine on the return at 1:30 PM although he had not eaten which was unusual for him.  Subsequently they found him stumbling and he had fallen and peed himself at around 5 PM.  He was home with his grandmother who called the patient's mother to come help her with him.  On mother's arrival she noted he was shaking all over consistent with a potential generalized tonic-clonic seizure and he was incoherent afterwards.  911 was called and on EMS arrival he had additional seizure activity, for which he was given benzodiazepines.  Subsequently he had an additional seizure in the ED, bit through his tongue and choked on it resulting in cardiopulmonary arrest for which he had approximately 20 minutes of CPR started immediately with 2 rounds of epinephrine required, during which time he was also intubated without paralytics.  On return of ROSC he continued to have significant tachycardia and remained unresponsive, he was started on midazolam and propofol  Regarding his mental health history, family notes he was hospitalized in mental hospital in [place] for 4 to 5 days in February.  He was transition to Depo Haldol but has not had any since March  Family notes that he does not take any of his medications, but his diabetes has been better recently (prior notes he  had an A1c as high as at least 11%).  They deny him complaining about any headaches, fevers, infectious symptoms or any other concerns until today other than he did not eat which is unusual for him.  [place] and mosquito exposure.  ROS: Unable to obtain due to altered mental status.   Past Medical History:  Diagnosis Date   Diabetes mellitus without complication ([place])    No past surgical history on file.  No family history on file.   Social History:  reports that he has been smoking cigarettes. He has been smoking an average of 1 pack per day. He has never used smokeless tobacco. He reports that he does not drink alcohol. No history on file for drug use.  Per family he drinks daily and uses marijuana and smokes 1 pack/day, no known other substance exposure but his marijuana is obtained on the street  Exam: Current vital signs: BP (!) 142/123   Pulse (!) 154   Resp (!) 23   Ht 6\' 1"  (1.854 m)   Wt 90 kg   SpO2 100%   BMI 26.18 kg/m  Vital signs in last 24 hours: Pulse Rate:  [115-156] 154 (06/08 1924) Resp:  [0-81] 23 (06/08 1924) BP: (119-177)/(67-123) 142/123 (06/08 1923) SpO2:  [91 %-100 %] 100 % (06/08 1924) FiO2 (%):  [100 %] 100 % (06/08 1840) Weight:  [90 kg] 90 kg (06/08 1900)   Physical Exam  Constitutional: Appears  well-developed and well-nourished.  Psych: Minimally interactive Eyes: Scleral edema is mild, he does have chemosis HENT: ET tube in place, copious amounts of blood continuing to come from his mouth MSK: no obvious joint deformities.  Cardiovascular: Normal rate and regular rhythm.  Respiratory: Intermittently bucking the ventilator and coughing GI: Soft.  No distension.  Skin: Warm dry and intact visible skin.  There is no significant edema   Neuro: Mental Status: Does not open eyes spontaneously, to voice or noxious stimulation Does not follow any commands Cranial  Nerves: II: No blink to threat. Pupils are equal, round, and reactive to light 3 to 2 mm III,IV, VI/VIII: Sluggish/incomplete VOR.  He does have initial midline gaze followed by disconjugate gaze V/VII: Facial sensation is symmetric to saline stimulation VIII: No response to voice X/XI: Intact cough/gag XII: Unable to assess tongue protrusion secondary to patient's mental status  Motor/Sensory: Tone is normal. Bulk is normal.  No movement to noxious stim in any of the extremities Plantar reflexes: Toes are mute bilaterally.  Cerebellar: Unable to assess secondary to patient's mental status    I have reviewed labs in epic and the results pertinent to this consultation are:   Basic Metabolic Panel: Recent Labs  Lab 01/14/2022 1928 01/20/2022 1945  NA 119* 117*  K 4.2 4.2  CL 85*  --   CO2 <7*  --   GLUCOSE 232*  --   BUN 10  --   CREATININE 1.30*  --   CALCIUM 7.5*  --   I-stat, full CMP pending  CBC: Recent Labs  Lab 02/02/2022 1928 01/11/2022 1945  WBC 22.9*  --   NEUTROABS 17.1*  --   HGB 15.1 17.0  HCT 47.0 50.0  MCV 92.9  --   PLT 223  --     Coagulation Studies: No results for input(s): "LABPROT", "INR" in the last 72 hours.    Lab Results  Component Value Date   HGBA1C 6.0 (H) 08/11/2021   Lab Results  Component Value Date   TSH 0.188 (L) 05/23/2017   UA notable for large hemoglobin pigment with no red blood cells, 5 ketones, rare bacteria Ethanol level 10 UDS positive for benzodiazepines (administered by EMS) and THC  I have reviewed the images obtained:    Assessment:   Impression:  - Status epilepticus, likely in the setting of alcohol withdrawal and hyponatremia - severed tongue with copious bleeding - intubated  - s/p arrest and ~20 min CPR with 2 rounds of epinephrine prior to ROSC  - AKI - Mild transaminitis (AST 109, ALT 46), with mild hyperammonemia to 49 - Mild elevation of troponin to 103 - Leukocytosis, possibly reactive secondary  to seizures  Initial Recommendations:  # Seizures and cardiac arrest - Ativan 2 mg  - STAT HCT, CTA head and neck - STAT EEG, then cEEG - UA, UDS - CK, trend  Additional recommendations:  # Seizures - S/p 2000 mg of Keppra per ED, adding an additional 2000 mg for a full 4 g loading dose - Pending additional labs will consider a maintenance agent (given his psychiatric history Keppra is not an ideal long-term choice) - Correction of sodium by 8-10 mEQ/24 hrs, q6 hr Na checks if hyponatremia confirmed on CMP, please target correction by 5 mEq fairly rapidly followed by more gradual correction to minimize risk of central pontine myelolysis - Correct other electrolyte derangements as indicated  - Check rectal temperature, if elevated may need LP - Q4 hr glucose checks,  low dose SSI, adjust as needed for goal euglcyemia - Trend CK to peak - Monitor for infection - Appreciate management of co-morbidities per ICU team  # EtOH abuse, AMS - Stat thiamine level - Empiric 500 mg Q8hr x 3 days thiamine IV followed by 100 mg daily (may reduce to 100 mg daily if level results normal) - B12, MMA, folate levels, TSH, T4 levels - Empiric multivitamin, folate - CIWAS  Lesleigh Noe MD-PhD [facility] [phone] Available 7 PM to 7 AM, outside of these hours please call Neurologist on call as listed on Amion.    Total critical care time: 65 minutes   Critical care time was exclusive of separately billable procedures and treating other patients.   Critical care was necessary to treat or prevent imminent or life-threatening deterioration.   Critical care was time spent personally by me on the following activities: development of treatment plan with patient and/or surrogate as well as nursing, discussions with consultants/primary team, evaluation of patient's response to treatment, examination of patient, obtaining history from patient or surrogate, ordering and performing  treatments and interventions, ordering and review of laboratory studies, ordering and review of radiographic studies, and re-evaluation of patient's condition as needed, as documented above.

## 2022-01-12 NOTE — ED Notes (Signed)
Upon ROSC, pt was fount to be in a SVT rhythm. MD Kommor decided to shock pt back into a better ryhtm. Patient was still hoooked up to pads from code, and was shocked twice. One shock was delivered at 1849 with no change. Second shock was administered at 1852 with no change.

## 2022-01-12 NOTE — H&P (Signed)
NAME:  Nicholas Carson, MRN:  [id number], DOB:  Jun 04, 1981, LOS: 0 ADMISSION DATE:  01/11/2022 CONSULTATION DATE:  01/11/2022 REFERRING MD:  Kommor - EDP CHIEF COMPLAINT:  Seizures  History of Present Illness:  41 year old man who presented to [facility] ED 6/8 via EMS for seizures. PMHx significant for T2DM (uncontrolled), hypertriglyceridemia, schizophrenia, polysubstance abuse (EtOH, THC, tobacco abuse).  Patient had been drinking with family (two 40oz beers) and became unresponsive. EMS called. On arrival witnessed seizure x 1 minute, IM Versed administered. Tongue laceration sustained during seizure. Patient had witnessed cardiac arrest in ED, received 20 minutes CPR, Epi x 2 with ROSC. Intubated and femoral CVC placed. Labs notable for WBC 22.9, Hgb 15.1 (baseline), Na 119, K 4.2, Bicarb < 7, glucose 232, Cr 1.30 (baseline 0.9), mild transaminitis AST 109, ALT 46. Trop 103. Ethanol level 10, UDS +THC and benzos (administered by EMS). CXR unremarkable, CT Head/Maxillofacial without acute abnormalities; CTA Head/Neck negative. Neurology consulted for seizures; AEDs/EEG ordered. ENT consulted for tongue laceration.  PCCM consulted for ICU admission.  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 Coded in ED with CPR x 20 mins and Epi x 2 with subsequent ROSC. CT Head/Maxillofacial negative for acute abnormalities. CTA Head/Neck negative. Neuro consulted. ENT consulted for tongue lac.  Interim History / Subjective:  PCCM consulted for admission  Objective:  Blood pressure 130/68, pulse 96, resp. rate (!) 26, height 6\' 1"  (1.854 m), weight 90 kg,  SpO2 100 %.    Vent Mode: PRVC FiO2 (%):  [100 %] 100 % Set Rate:  [18 bmp] 18 bmp Vt Set:  [640 mL] 640 mL PEEP:  [5 cmH20] 5 cmH20   Intake/Output Summary (Last 24 hours) at 01/22/2022 2121 Last data filed at 02/02/2022 1924 Gross per 24 hour  Intake 277.66 ml  Output --  Net 277.66 ml   Filed Weights   01/07/2022 1900  Weight: 90 kg   Physical Examination: General: Acutely ill-appearing middle-aged man in NAD. HEENT: [place]/AT, anicteric sclera, PERRL 24mm, moist mucous membranes. ETT in place. Traumatic tongue laceration sustained during seizure with blood in oropharynx/coming out of nares. Neuro: Sedated. Does not respond to verbal, tactile or noxious stimuli. Not following commands. +Corneal, +Cough, and +Gag  CV: RRR, no m/g/r. PULM: Breathing even and unlabored on vent (PEEP 5, FiO2 40%). Lung fields coarse throughout. GI: Soft, nontender, nondistended. Normoactive bowel sounds. Extremities: No LE edema noted. Skin: Warm/dry, no rashes.  Resolved Hospital Problem List:    Assessment & Plan:  Status epilepticus, likely associated with EtOH abuse/hyponatremia Presented via EMS for unresponsiveness, then witnessed full body seizures. Versed 5mg  IM given en route. Tongue lac (as below). CT Head/Maxillofacial negative. CTA Head/Neck WNL. - Admit to ICU - Neuro consulted, appreciate recommendations - Additional neuroimaging per Neuro - STAT EEG, then LTM - AEDs per Neuro (Keppra, eventual transition to different agent given psychiatric history) - Slow Na correction to minimize risk of CPM - Trend CK - Consider LP if fever, increased concern for infection  Witnessed cardiac arrest with ROSC Cardiac arrest in ED, witnessed by EDP. CPR x 20 minutes and Epi  x 2 with ROSC. -  ICU monitoring - Trend troponin - Cardiac monitoring/telemetry - Per Cards, rhythm NOT Afib, sinus tach with PACs  Acute hypoxemic respiratory failure in the setting of seizure, cardiac arrest Possible  aspiration pneumonia - Continue full vent support (4-8cc/kg IBW) - Wean FiO2 for O2 sat > 90% - Daily WUA/SBT - VAP bundle - Bronchodilators as ordered - Pulmonary hygiene - PAD protocol for sedation: Propofol and Fentanyl for goal RASS -1 to -2 - Unasyn for possible aspiration coverage  Tongue laceration with acute blood loss - ENT consulted for evaluation, possible repair - T&S sent - Trend H&H, monitor for active bleeding - Transfuse for Hgb < 7.0 or hemodynamically significant bleeding - Unasyn for antibiotic coverage  AKI Hyponatremia - Trend BMP - Replete electrolytes as indicated - Monitor I&Os - F/u urine studies - Avoid nephrotoxic agents as able - Ensure adequate renal perfusion  EtOH abuse with possible withdrawal - Monitor for signs/symptoms of withdrawal - Consider Phenobarbital - Continue Versed PRN - Continue thiamine - MV when able to tolerate PO  Tobacco abuse THC abuse - Encourage cessation when able to participate in care  Hypertriglyceridemia Home meds: Tricor - Hold Tricor at present  T2DM, poorly controlled A1C 6.5%. Home meds: Janumet-XR 50-1000. - SSI - CBGs Q4H  History of schizophrenia No home medications in chart.   Best Practice: (right click and "Reselect all SmartList Selections" daily)   Diet/type: NPO w/ meds via tube DVT prophylaxis: SCDs, hold AC in setting of tongue lac GI prophylaxis: PPI Lines: Central line Foley:  Yes, and it is still needed Code Status:  full code Last date of multidisciplinary goals of care discussion [Pending]  Labs:  CBC: Recent Labs  Lab 01/11/2022 1928 01/25/2022 1945  WBC 22.9*  --   NEUTROABS 17.1*  --   HGB 15.1 17.0  HCT 47.0 50.0  MCV 92.9  --   PLT 223  --    Basic Metabolic Panel: Recent Labs  Lab 01/16/2022 1928 01/15/2022 1945  NA 119* 117*  K 4.2 4.2  CL 85*  --   CO2 <7*  --   GLUCOSE 232*  --   BUN 10  --   CREATININE 1.30*  --   CALCIUM 7.5*  --   MG 2.2  --     GFR: Estimated Creatinine Clearance: 83.7 mL/min (A) (by C-G formula based on SCr of 1.3 mg/dL (H)). Recent Labs  Lab 01/07/2022 1928  WBC 22.9*   Liver Function Tests: Recent Labs  Lab 01/19/2022 1928  AST 109*  ALT 46*  ALKPHOS 61  BILITOT 0.8  PROT 5.8*  ALBUMIN 3.0*   No results for input(s): "LIPASE", "AMYLASE" in the last 168 hours. No results for input(s): "AMMONIA" in the last 168 hours.  ABG:    Component Value Date/Time   PHART 7.602 (HH) 05/24/2017 1015   PCO2ART 39.2 05/24/2017 1015   PO2ART 99.0 05/24/2017 1015   HCO3 5.4 (L) 01/23/2022 1945   TCO2 6 (L) 01/07/2022 1945   ACIDBASEDEF 28.0 (H) 01/30/2022 1945   O2SAT 98 01/09/2022 1945    Coagulation Profile: No results for input(s): "INR", "PROTIME" in the last 168 hours.  Cardiac Enzymes: No results for input(s): "CKTOTAL", "CKMB", "CKMBINDEX", "TROPONINI" in the last 168 hours.  HbA1C: Hgb A1c MFr Bld  Date/Time Value Ref Range Status  08/11/2021 11:05 AM 6.0 (H) 4.8 - 5.6 % Final    Comment:    (NOTE) Pre diabetes:  5.7%-6.4%  Diabetes:              >6.4%  Glycemic control for   <7.0% adults with diabetes    CBG: No results for input(s): "GLUCAP" in the last 168 hours.  Review of Systems:   Patient is encephalopathic and/or intubated. Therefore history has been obtained from chart review.   Past Medical History:  He,  has a past medical history of Diabetes mellitus without complication ([place]).   Surgical History:  No past surgical history on file.   Social History:   reports that he has been smoking cigarettes. He has been smoking an average of 1 pack per day. He has never used smokeless tobacco. He reports that he does not drink alcohol.   Family History:  His family history is not on file.   Allergies: No Known Allergies   Home Medications: Prior to Admission medications   Medication Sig Start Date End Date Taking? Authorizing Provider  fenofibrate (TRICOR) 145 MG  tablet Take 1 tablet (145 mg total) by mouth daily. Patient not taking: Reported on 08/11/2021 05/26/17   Mariel Aloe, MD  JANUMET XR 50-1000 MG TB24 Take 1 tablet by mouth 2 (two) times daily. Patient not taking: Reported on 08/11/2021 01/19/16   [provider]  nicotine (NICODERM CQ - DOSED IN MG/24 HOURS) 14 mg/24hr patch Place 1 patch (14 mg total) onto the skin daily. Patient not taking: Reported on 08/11/2021 05/26/17   Mariel Aloe, MD  RESTASIS 0.05 % ophthalmic emulsion Place 1 drop into the left eye 2 (two) times daily. Patient not taking: Reported on 08/11/2021 05/11/17   [provider]  VENTOLIN HFA 108 (90 Base) MCG/ACT inhaler Inhale 2 puffs into the lungs every 6 (six) hours as needed. Patient not taking: Reported on 08/11/2021 04/13/17   [provider]    Critical care time: 50 minutes   Lestine Mount, PA-C [facility] 01/05/2022 9:21 PM  Please see [email or web address] for pager details.  From 7A-7P if no response, please call [phone] After hours, please call ELink [phone]

## 2022-01-12 NOTE — ED Provider Notes (Signed)
Procedure Name: Intubation Date/Time: 01/16/2022 11:30 PM  Performed by: Blanchie Dessert, MDVentilation: Unable to mask ventilate Laryngoscope Size: Mac and 4 Grade View: Grade I Tube size: 7.5 mm Number of attempts: 3 Secured at: 23 cm Tube secured with: ETT holder Dental Injury: Bloody posterior oropharynx  Difficulty Due To: Difficulty was anticipated Comments: Tongue injury and edema with limited visibility        Blanchie Dessert, MD 01/20/2022 2331

## 2022-01-12 NOTE — Progress Notes (Signed)
eLink Physician-Brief Progress Note Patient Name: Nicholas Carson DOB: 1980-08-28 MRN: [id number]   Date of Service  01/31/2022  HPI/Events of Note  Patient febrile to 101.6 F post cardiac arrest. Nursing request for normothermic therapeutic temperature management protocol.   eICU Interventions  Plan: Normothermic therapeutic temperature management protocol.     Intervention Category Major Interventions: Other:  Lenell Antu 01/25/2022, 11:45 PM

## 2022-01-12 NOTE — ED Notes (Signed)
Patients mother called and would like a status update. Phone is [phone]

## 2022-01-12 NOTE — Progress Notes (Signed)
LTM EEG hooked up and running - no initial skin breakdown - push button tested - neuro notified. Patient currently in ED. No [facility] monitoring at this time.

## 2022-01-12 NOTE — ED Notes (Signed)
Delay in intubation d/t tongue trauma during seizure. Patient intubated at this time.

## 2022-01-12 NOTE — Progress Notes (Signed)
Pt transported to CT and back to ER1 w/o complications. RT will cont to monitor as needed.

## 2022-01-12 NOTE — ED Notes (Signed)
Patient's HR rapidly decreased on monitor with EDP and this RN at bedside. This RN, EDP and RN Myckenzie prepared to start compressions and placed pt on pads and Zoll. At this point pt had no palpable pulse and a code was called.

## 2022-01-12 NOTE — Progress Notes (Signed)
Asked to evaluate arrhythmia and critically ill patient after seizure complicated by respiratory arrest due to self-inflicted tongue trauma. Apparent rhythm on monitor was atrial fibrillation, but the patient did not respond to cardioversion x2. Review of the telemetry tracings as well as ECG shows that the background rhythm appears to be sinus tachycardia with brief bursts of ectopic atrial tachycardia and frequent PACs, which explains the irregularity of the rhythm.  He does not have atrial fibrillation. Since the background rhythm is automatic, cardioversion will not have an effect on it. Recommend treating his critical illness.  Specific treatment for the ectopic atrial beats is probably not indicated at this time.  We will be happy to reevaluate the patient if the situation changes.

## 2022-01-12 NOTE — Progress Notes (Signed)
EEG complete - results pending 

## 2022-01-12 NOTE — ED Notes (Signed)
This RN, EDP, and Therapist, nutritional at bedside. Patient noted to be having a full body seizure. Suction was previously set up as a precaution and this RN started to suction patient's mouth as he had bit further down on his tongue and was coughing up blood. Patient was suctioned both by this RN and EDP to maintain airway. At this point, RT has been called to bedside in preparation for intubation.

## 2022-01-12 NOTE — ED Provider Notes (Signed)
Central Venous Catheter Insertion Procedure Note  Demontray Franta  [id number]  [birth year]  Date:02-10-22  Time:7:01 PM   Provider Performing:Zohar Laing Axel Filler   Procedure: Insertion of Non-tunneled Central Venous Catheter(36556) with US guidance (73220)   Indication(s) Difficult access  Consent Unable to obtain consent due to emergent nature of procedure.  Anesthesia N/A  Timeout Unable to perform secondary to acuity of condition  Sterile Technique Maximal sterile technique was not able to be achieved due to emergent nature of procedure.  Procedure Description Area of catheter insertion was cleaned with chlorhexidine and draped in sterile fashion.  With real-time ultrasound guidance a central venous catheter was placed into the right femoral vein. Nonpulsatile blood flow and easy flushing noted in all ports.  The catheter was sutured in place and sterile dressing applied.  Complications/Tolerance Chest x-ray is not ordered for femoral cannulation.  EBL Minimal  Specimen(s) None     Devine Klingel, [place], MD Feb 10, 2022 1901    Gwyneth Sprout, MD 01/13/22 1620

## 2022-01-12 NOTE — Progress Notes (Signed)
PHARMACY ANTIBIOTIC CONSULT NOTE   Nicholas Carson a 41 y.o. male admitted on 01-26-22 with seizure-activity. Patient bit through his tongue leading to respiratory failure/cardiac arrest. Pharmacy has consulted to dose unasyn for possible aspiration pneumonia. .  Scr 1.30 (2022-01-26), WBC 22.9 (01/26/2022)  01/26/22: Scr 1.30, WBC 22.9, Na 116  Vital Signs: HR elevated, BP elevated  Estimated Creatinine Clearance: 83.7 mL/min (A) (by C-G formula based on SCr of 1.3 mg/dL (H)).  Plan: START Unasyn 3g IV Q6H  Monitor renal function, clinical status, C/S, de-escalation  Monitor Na while on Unasyn (if patient is to receive hypertonic saline)   Allergies:  No Known Allergies  Filed Weights   01-26-2022 1900  Weight: 90 kg (198 lb 6.6 oz)       Latest Ref Rng & Units 2022-01-26    9:24 PM 01-26-22    7:45 PM Jan 26, 2022    7:28 PM  CBC  WBC 4.0 - 10.5 K/uL   22.9   Hemoglobin 13.0 - 17.0 g/dL 60.6  30.1  60.1   Hematocrit 39.0 - 52.0 % 48.0  50.0  47.0   Platelets 150 - 400 K/uL   223    Antimicrobials this admission: Unasyn 01-26-2022>>   Microbiology results: 26-Jan-2022 Bcx: sent  Thank you for allowing pharmacy to be a part of this patient's care.  Jani Gravel, PharmD PGY-1 Acute Care Resident  Jan 26, 2022 10:07 PM

## 2022-01-12 NOTE — ED Notes (Signed)
Pulse check at this time. Pulses were palpated. ROSC was achieved.

## 2022-01-12 NOTE — Progress Notes (Signed)
Attempted to call mother, left a message asking her to call back for an update.  Steffanie Dunn, DO Feb 11, 2022 11:15 PM [place] [facility]

## 2022-01-12 NOTE — ED Provider Notes (Signed)
[facility] EMERGENCY DEPARTMENT Provider Note  CSN: [id number] Arrival date & time: 01/13/2022 1757  Chief Complaint(s) AMS, Seizures, and Aggressive Behavior  HPI Nicholas Carson is a 41 y.o. male with PMH T2DM, paranoid schizophrenia, daily alcohol use who presents emergency department for evaluation of altered mental status and seizure history obtained from EMS and multiple family members who states that the patient is a daily alcohol drinker and had 2 40 ounce malt liquor drinks this morning.  He then had an episode of shaking that lasted approximately 2 minutes, self resolved and he was confused afterward.  They then called EMS and while with EMS, the patient had another seizure event requiring 5 mg of midazolam.  Patient suffered a tongue laceration at that time.  Patient arrives postictal and combative.  Additional review of systems unable to be obtained secondary to patient's altered mental status.  Patient has no history of seizures and family denies any additional illicit substance use other than marijuana.   Past Medical History Past Medical History:  Diagnosis Date   Diabetes mellitus without complication ([place])    Hypertriglyceridemia 01/25/2022   Paranoid schizophrenia ([place]) 05/26/2017   Tobacco abuse 01/16/2022   Patient Active Problem List   Diagnosis Date Noted   Hypertriglyceridemia 01/19/2022   Tobacco abuse 01/13/2022   Seizure ([place]) 01/15/2022   Paranoid schizophrenia ([place]) [id number]   Salicylate intoxication 05/23/2017   Acute kidney injury ([place]) 0000000   Metabolic acidosis due to salicylate 0000000   Leukocytosis 05/23/2017   Hyperglycemia 05/23/2017   Diabetes ([place]) 05/23/2017   Respiratory failure, acute ([place]) 05/23/2017   On mechanically assisted ventilation ([place]) 05/23/2017   Encounter for intubation    Home Medication(s) Prior to Admission medications   Medication Sig Start Date End Date Taking? Authorizing Provider   Pseudoephedrine-Acetaminophen (SINUS RELIEF PO) Take 1 tablet by mouth 2 (two) times daily as needed (sinus congestion).   Yes [provider]  fenofibrate (TRICOR) 145 MG tablet Take 1 tablet (145 mg total) by mouth daily. Patient not taking: Reported on 08/11/2021 05/26/17   Mariel Aloe, MD  JANUMET XR 50-1000 MG TB24 Take 1 tablet by mouth 2 (two) times daily. Patient not taking: Reported on 08/11/2021 01/19/16   [provider]  nicotine (NICODERM CQ - DOSED IN MG/24 HOURS) 14 mg/24hr patch Place 1 patch (14 mg total) onto the skin daily. Patient not taking: Reported on 01/05/2022 05/26/17   Mariel Aloe, MD  RESTASIS 0.05 % ophthalmic emulsion Place 1 drop into the left eye 2 (two) times daily. Patient not taking: Reported on 08/11/2021 05/11/17   [provider]  VENTOLIN HFA 108 (90 Base) MCG/ACT inhaler Inhale 2 puffs into the lungs every 6 (six) hours as needed. Patient not taking: Reported on 08/11/2021 04/13/17   [provider]  Past Surgical History No past surgical history on file. Family History No family history on file.  Social History Social History   Tobacco Use   Smoking status: Every Day    Packs/day: 1.00    Types: Cigarettes   Smokeless tobacco: Never  Substance Use Topics   Alcohol use: No   Allergies Patient has no known allergies.  Review of Systems Review of Systems  HENT:         Tongue laceration  Neurological:  Positive for seizures.  Psychiatric/Behavioral:  Positive for confusion.     Physical Exam Vital Signs  I have reviewed the triage vital signs BP (!) 158/125   Pulse (!) 144   Resp (!) 30   Ht 6\' 1"  (1.854 m)   Wt 90 kg   SpO2 98%   BMI 26.18 kg/m   Physical Exam Vitals and nursing note reviewed.  Constitutional:      General: He is in acute distress.     Appearance:  He is well-developed. He is toxic-appearing.  HENT:     Head: Normocephalic.     Comments: Copious bleeding coming from tongue Cardiovascular:     Heart sounds: No murmur heard. Abdominal:     Palpations: Abdomen is soft.     Tenderness: There is no abdominal tenderness.  Musculoskeletal:        General: No swelling.     Cervical back: Neck supple.  Skin:    Capillary Refill: Capillary refill takes less than 2 seconds.  Neurological:     Mental Status: He is alert. He is disoriented.     Comments: Fasciculations intermittently     ED Results and Treatments Labs (all labs ordered are listed, but only abnormal results are displayed) Labs Reviewed  COMPREHENSIVE METABOLIC PANEL - Abnormal; Notable for the following components:      Result Value   Sodium 119 (*)    Chloride 85 (*)    CO2 <7 (*)    Glucose, Bld 232 (*)    Creatinine, Ser 1.30 (*)    Calcium 7.5 (*)    Total Protein 5.8 (*)    Albumin 3.0 (*)    AST 109 (*)    ALT 46 (*)    All other components within normal limits  CBC WITH DIFFERENTIAL/PLATELET - Abnormal; Notable for the following components:   WBC 22.9 (*)    Neutro Abs 17.1 (*)    Abs Immature Granulocytes 1.12 (*)    All other components within normal limits  RAPID URINE DRUG SCREEN, HOSP PERFORMED - Abnormal; Notable for the following components:   Benzodiazepines POSITIVE (*)    Tetrahydrocannabinol POSITIVE (*)    All other components within normal limits  ETHANOL - Abnormal; Notable for the following components:   Alcohol, Ethyl (B) 10 (*)    All other components within normal limits  URINALYSIS, ROUTINE W REFLEX MICROSCOPIC - Abnormal; Notable for the following components:   Color, Urine STRAW (*)    Hgb urine dipstick LARGE (*)    Ketones, ur 5 (*)    Bacteria, UA RARE (*)    All other components within normal limits  HEMOGLOBIN A1C - Abnormal; Notable for the following components:   Hgb A1c MFr Bld 6.5 (*)    All other components within  normal limits  CBG MONITORING, ED - Abnormal; Notable for the following components:   Glucose-Capillary 64 (*)    All other components within normal limits  I-STAT VENOUS BLOOD GAS, ED -  Abnormal; Notable for the following components:   pH, Ven 6.870 (*)    pCO2, Ven 29.6 (*)    pO2, Ven 178 (*)    Bicarbonate 5.4 (*)    TCO2 6 (*)    Acid-base deficit 28.0 (*)    Sodium 117 (*)    Calcium, Ion 0.95 (*)    All other components within normal limits  I-STAT ARTERIAL BLOOD GAS, ED - Abnormal; Notable for the following components:   pH, Arterial 7.249 (*)    pO2, Arterial 478 (*)    Bicarbonate 16.8 (*)    TCO2 18 (*)    Acid-base deficit 10.0 (*)    Sodium 116 (*)    Calcium, Ion 1.04 (*)    All other components within normal limits  CBG MONITORING, ED - Abnormal; Notable for the following components:   Glucose-Capillary 208 (*)    All other components within normal limits  CBG MONITORING, ED - Abnormal; Notable for the following components:   Glucose-Capillary 196 (*)    All other components within normal limits  TROPONIN I (HIGH SENSITIVITY) - Abnormal; Notable for the following components:   Troponin I (High Sensitivity) 103 (*)    All other components within normal limits  CULTURE, BLOOD (ROUTINE X 2)  CULTURE, BLOOD (ROUTINE X 2)  MAGNESIUM  BLOOD GAS, VENOUS  TRIGLYCERIDES  CK  VITAMIN B1  VITAMIN B12  METHYLMALONIC ACID, SERUM  FOLATE  T4, FREE  TSH  SODIUM  SODIUM  BLOOD GAS, ARTERIAL  HIV ANTIBODY (ROUTINE TESTING W REFLEX)  CBC  MAGNESIUM  PHOSPHORUS  COMPREHENSIVE METABOLIC PANEL  SODIUM  TYPE AND SCREEN  TROPONIN I (HIGH SENSITIVITY)                                                                                                                          Radiology CT ANGIO HEAD NECK W WO CM  Result Date: 01/11/2022 CLINICAL DATA:  Found unresponsive.  Alcohol use. EXAM: CT ANGIOGRAPHY HEAD AND NECK TECHNIQUE: Multidetector CT imaging of the head and  neck was performed using the standard protocol during bolus administration of intravenous contrast. Multiplanar CT image reconstructions and MIPs were obtained to evaluate the vascular anatomy. Carotid stenosis measurements (when applicable) are obtained utilizing NASCET criteria, using the distal internal carotid diameter as the denominator. RADIATION DOSE REDUCTION: This exam was performed according to the departmental dose-optimization program which includes automated exposure control, adjustment of the mA and/or kV according to patient size and/or use of iterative reconstruction technique. CONTRAST:  61mL OMNIPAQUE IOHEXOL 350 MG/ML SOLN COMPARISON:  None Available. FINDINGS: CTA NECK FINDINGS SKELETON: There is no bony spinal canal stenosis. No lytic or blastic lesion. OTHER NECK: Normal pharynx, larynx and major salivary glands. No cervical lymphadenopathy. Unremarkable thyroid gland. UPPER CHEST: No pneumothorax or pleural effusion. No nodules or masses. Endotracheal tube tip at the level of the clavicular heads. AORTIC ARCH: There is no calcific atherosclerosis of the aortic arch. There  is no aneurysm, dissection or hemodynamically significant stenosis of the visualized portion of the aorta. Conventional 3 vessel aortic branching pattern. The visualized proximal subclavian arteries are widely patent. RIGHT CAROTID SYSTEM: Normal without aneurysm, dissection or stenosis. LEFT CAROTID SYSTEM: Normal without aneurysm, dissection or stenosis. VERTEBRAL ARTERIES: Left dominant configuration. Both origins are clearly patent. There is no dissection, occlusion or flow-limiting stenosis to the skull base (V1-V3 segments). CTA HEAD FINDINGS POSTERIOR CIRCULATION: --Vertebral arteries: Normal V4 segments. --Inferior cerebellar arteries: Normal. --Basilar artery: Normal. --Superior cerebellar arteries: Normal. --Posterior cerebral arteries (PCA): Normal. ANTERIOR CIRCULATION: --Intracranial internal carotid arteries:  Normal. --Anterior cerebral arteries (ACA): Normal. Both A1 segments are present. Patent anterior communicating artery (a-comm). --Middle cerebral arteries (MCA): Normal. VENOUS SINUSES: As permitted by contrast timing, patent. ANATOMIC VARIANTS: None Review of the MIP images confirms the above findings. IMPRESSION: Normal CTA of the head and neck. Electronically Signed   By: Ulyses Jarred M.D.   On: 01/21/2022 21:22   CT Head Wo Contrast  Result Date: 01/16/2022 CLINICAL DATA:  Facial trauma, blunt seizure, tounge injury; Seizure, new-onset, history of trauma EXAM: CT HEAD WITHOUT CONTRAST CT MAXILLOFACIAL WITHOUT CONTRAST TECHNIQUE: Multidetector CT imaging of the head and maxillofacial structures were performed using the standard protocol without intravenous contrast. Multiplanar CT image reconstructions of the maxillofacial structures were also generated. RADIATION DOSE REDUCTION: This exam was performed according to the departmental dose-optimization program which includes automated exposure control, adjustment of the mA and/or kV according to patient size and/or use of iterative reconstruction technique. COMPARISON:  CT head 05/23/2017 FINDINGS: CT HEAD FINDINGS Brain: No evidence of large-territorial acute infarction. No parenchymal hemorrhage. No mass lesion. No extra-axial collection. No mass effect or midline shift. No hydrocephalus. Basilar cisterns are patent. Vascular: No hyperdense vessel. Skull: No acute fracture or focal lesion. Other: None. CT MAXILLOFACIAL FINDINGS Osseous: No fracture or mandibular dislocation. No destructive process. Sinuses/Orbits: Bilateral maxillary and ethmoid mucosal thickening. Paranasal sinuses and mastoid air cells are clear. The orbits are unremarkable. Soft tissues: Partially visualized endotracheal tube. IMPRESSION: 1.  No acute intracranial abnormality. 2. No acute displaced facial fracture. Electronically Signed   By: Iven Finn M.D.   On: 01/27/2022 20:45    CT Maxillofacial Wo Contrast  Result Date: 01/11/2022 CLINICAL DATA:  Facial trauma, blunt seizure, tounge injury; Seizure, new-onset, history of trauma EXAM: CT HEAD WITHOUT CONTRAST CT MAXILLOFACIAL WITHOUT CONTRAST TECHNIQUE: Multidetector CT imaging of the head and maxillofacial structures were performed using the standard protocol without intravenous contrast. Multiplanar CT image reconstructions of the maxillofacial structures were also generated. RADIATION DOSE REDUCTION: This exam was performed according to the departmental dose-optimization program which includes automated exposure control, adjustment of the mA and/or kV according to patient size and/or use of iterative reconstruction technique. COMPARISON:  CT head 05/23/2017 FINDINGS: CT HEAD FINDINGS Brain: No evidence of large-territorial acute infarction. No parenchymal hemorrhage. No mass lesion. No extra-axial collection. No mass effect or midline shift. No hydrocephalus. Basilar cisterns are patent. Vascular: No hyperdense vessel. Skull: No acute fracture or focal lesion. Other: None. CT MAXILLOFACIAL FINDINGS Osseous: No fracture or mandibular dislocation. No destructive process. Sinuses/Orbits: Bilateral maxillary and ethmoid mucosal thickening. Paranasal sinuses and mastoid air cells are clear. The orbits are unremarkable. Soft tissues: Partially visualized endotracheal tube. IMPRESSION: 1.  No acute intracranial abnormality. 2. No acute displaced facial fracture. Electronically Signed   By: Iven Finn M.D.   On: 02/02/2022 20:45   DG Chest Portable 1 View  Result Date:  01/11/2022 CLINICAL DATA:  Intubated EXAM: PORTABLE CHEST 1 VIEW COMPARISON:  05/24/2017 FINDINGS: Endotracheal tube tip is about 4.2 cm superior to the carina. Normal cardiac size. No pneumothorax. No focal airspace disease IMPRESSION: Endotracheal tube tip about 4.2 cm superior to carina. The lung fields are grossly clear Electronically Signed   By: Donavan Foil  M.D.   On: 01/30/2022 19:26    Pertinent labs & imaging results that were available during my care of the patient were reviewed by me and considered in my medical decision making (see MDM for details).  Medications Ordered in ED Medications  propofol (DIPRIVAN) 1000 MG/100ML infusion (25 mcg/kg/min  90 kg Intravenous Infusion Verify 01/25/2022 2233)  midazolam (VERSED) injection 2 mg (2 mg Intravenous Given 01/23/2022 1923)  midazolam (VERSED) injection 2 mg (2 mg Intravenous Given 01/22/2022 2146)  insulin aspart (novoLOG) injection 0-9 Units (2 Units Subcutaneous Given 01/19/2022 2215)  thiamine 500mg  in normal saline (55ml) IVPB (0 mg Intravenous Stopped 01/20/2022 2234)    Followed by  thiamine (B-1) injection 100 mg (has no administration in time range)  folic acid (FOLVITE) tablet 1 mg (has no administration in time range)  multivitamin with minerals tablet 1 tablet (has no administration in time range)  midazolam (VERSED) injection 1-2 mg (has no administration in time range)  pantoprazole sodium (PROTONIX) 40 mg/20 mL oral suspension 40 mg (has no administration in time range)  docusate (COLACE) 50 MG/5ML liquid 100 mg (has no administration in time range)  polyethylene glycol (MIRALAX / GLYCOLAX) packet 17 g (has no administration in time range)  fentaNYL (SUBLIMAZE) injection 50 mcg (has no administration in time range)  fentaNYL 2526mcg in NS 233mL (72mcg/ml) infusion-PREMIX (has no administration in time range)  fentaNYL (SUBLIMAZE) bolus via infusion 50-100 mcg (has no administration in time range)  Ampicillin-Sulbactam (UNASYN) 3 g in sodium chloride 0.9 % 100 mL IVPB (has no administration in time range)  ipratropium-albuterol (DUONEB) 0.5-2.5 (3) MG/3ML nebulizer solution 3 mL (has no administration in time range)  albuterol (PROVENTIL) (2.5 MG/3ML) 0.083% nebulizer solution 2.5 mg (has no administration in time range)  LORazepam (ATIVAN) 2 MG/ML injection (2 mg Intramuscular Given 01/23/2022 1820)   LORazepam (ATIVAN) 2 MG/ML injection (2 mg Intramuscular Given 01/31/2022 1825)  LORazepam (ATIVAN) 2 MG/ML injection (2 mg Intramuscular Given 02/01/2022 1820)  levETIRAcetam (KEPPRA) 2,000 mg in sodium chloride 0.9 % 250 mL IVPB (0 mg Intravenous Stopped 02/02/2022 1924)  0.9 %  sodium chloride infusion ( Intravenous Stopped 01/16/2022 1953)  LORazepam (ATIVAN) injection 2 mg (2 mg Intravenous Given 01/09/2022 1955)  sodium bicarbonate injection 50 mEq (50 mEq Intravenous Given 01/17/2022 2113)  levETIRAcetam (KEPPRA) 2,000 mg in sodium chloride 0.9 % 250 mL IVPB (0 mg Intravenous Stopped 01/30/2022 2211)  iohexol (OMNIPAQUE) 350 MG/ML injection 60 mL (60 mLs Intravenous Contrast Given 01/18/2022 2108)  sodium chloride 0.9 % bolus 1,000 mL (1,000 mLs Intravenous New Bag/Given 02/02/2022 2143)  EPINEPHrine (ADRENALIN) 1 MG/10ML injection (1 mg Intravenous Given 01/31/2022 1841)  Procedures .Critical Care  Performed by: Teressa Lower, MD Authorized by: Teressa Lower, MD   Critical care provider statement:    Critical care time (minutes):  100   Critical care was necessary to treat or prevent imminent or life-threatening deterioration of the following conditions:  Cardiac failure, CNS failure or compromise and metabolic crisis   Critical care was time spent personally by me on the following activities:  Development of treatment plan with patient or surrogate, discussions with consultants, evaluation of patient's response to treatment, examination of patient, ordering and review of laboratory studies, ordering and review of radiographic studies, ordering and performing treatments and interventions, pulse oximetry, re-evaluation of patient's condition and review of old charts .Cardioversion  Date/Time: 01/30/2022 10:56 PM  Performed by: Teressa Lower, MD Authorized by: Teressa Lower, MD    Consent:    Consent obtained:  Emergent situation Pre-procedure details:    Cardioversion basis:  Emergent   Rhythm:  Atrial fibrillation   Electrode placement:  Anterior-posterior Patient sedated: Yes. Refer to sedation procedure documentation for details of sedation.  Attempt one:    Cardioversion mode:  Synchronous   Shock (Joules):  200   Shock outcome:  Conversion to normal sinus rhythm Attempt two:    Cardioversion mode:  Synchronous   Shock (Joules):  200   Shock outcome:  No change in rhythm Post-procedure details:    Patient status:  Unresponsive Comments:     Unsuccessful  Procedure Name: Intubation Date/Time: 01/22/2022 11:02 PM  Performed by: Teressa Lower, MDPreoxygenation: Pre-oxygenation with 100% oxygen Laryngoscope Size: Glidescope and 3 Grade View: Grade IV Tube size: 7.5 mm Number of attempts: 1 Airway Equipment and Method: Video-laryngoscopy Difficulty Due To: Difficulty was anticipated Comments: Initial intubation attempt unsuccessful, please see Dr. Acquanetta Chain note for successful intubation      (including critical care time)  Medical Decision Making / ED Course   This patient presents to the ED for concern of seizure, altered mental status, this involves an extensive number of treatment options, and is a complaint that carries with it a high risk of complications and morbidity.  The differential diagnosis includes alcohol withdrawal, status epilepticus, electrolyte abnormality, intracranial bleed, intracranial mass  MDM: Emergency department for evaluation of a seizure and altered mental status.  On initial physical exam, patient is disoriented and combative, nonredirectable with intermittent muscle fasciculations.  He has a obvious tongue laceration with bleeding in the mouth and when attempting to suction the blood out of the patient's mouth, the patient is throwing punches at healthcare staff.  Patient received 2 additional milligrams of Ativan  intramuscularly followed by an additional 2 mg when agitation did not improve.  For a brief period of time he required hard restraints to prevent the patient from [place] staff as we attempted to protect his airway.  The patient then suffered an additional significant seizure with a severe tongue bite that required an additional 6 mg of IM Ativan.  The seizure ultimately resolved, but the patient appears to have aspirated a large amount of tongue tissue and patient suffered a hypoxic cardiac arrest.  CPR was initiated immediately and patient received 3 rounds of CPR with return of spontaneous circulation.  Patient was emergently intubated with assistance by Dr. Maryan Rued via direct laryngoscopy as video laryngoscopy was unsuccessful by myself due to a copious amount of tissue in the airway.  Central line placed by emergency medicine resident and patient started on a propofol drip for seizure control.  Upon return of  spontaneous circulation, patient appears to be in a rapid irregular rhythm and given critical current presentation, 2 synchronized cardioversions were performed at 200 J with unfortunately no return of sinus rhythm.  Cardiology then consulted who states that if rate control were to be performed should be done with amiodarone but at this time he has other critical illness that needs to be corrected.  Neurology consulted who recommended additional Keppra load, head CT and CT angio which were all reassuringly negative. Labs for then obtained after patient was intubated and sedated which shows an initial pH of 6.87, leukocytosis 22.9. sodium 119, bicarb undetectable, creatinine 1.3, AST 109, ALT 46, alcohol slightly elevated at 10, UDS positive for benzos and THC, UA unremarkable.  Bicarb given.  Patient will require ICU admission secondary to critical illness.  Repeat ABGs improving.  Of note, I also consulted the ENT surgeon on-call given the patient's significant tongue lacerations.  He  evaluated the patient at bedside and states that the bleeding is currently under control and patient may benefit from a laceration repair, but as the patient is still bucking and biting the vent, it is unsafe to perform this procedure at this current time.  ENT will reevaluate in the morning.  Additional history obtained: -Additional history obtained from multiple family members -External records from outside source obtained and reviewed including: Chart review including previous notes, labs, imaging, consultation notes   Lab Tests: -I ordered, reviewed, and interpreted labs.   The pertinent results include:   Labs Reviewed  COMPREHENSIVE METABOLIC PANEL - Abnormal; Notable for the following components:      Result Value   Sodium 119 (*)    Chloride 85 (*)    CO2 <7 (*)    Glucose, Bld 232 (*)    Creatinine, Ser 1.30 (*)    Calcium 7.5 (*)    Total Protein 5.8 (*)    Albumin 3.0 (*)    AST 109 (*)    ALT 46 (*)    All other components within normal limits  CBC WITH DIFFERENTIAL/PLATELET - Abnormal; Notable for the following components:   WBC 22.9 (*)    Neutro Abs 17.1 (*)    Abs Immature Granulocytes 1.12 (*)    All other components within normal limits  RAPID URINE DRUG SCREEN, HOSP PERFORMED - Abnormal; Notable for the following components:   Benzodiazepines POSITIVE (*)    Tetrahydrocannabinol POSITIVE (*)    All other components within normal limits  ETHANOL - Abnormal; Notable for the following components:   Alcohol, Ethyl (B) 10 (*)    All other components within normal limits  URINALYSIS, ROUTINE W REFLEX MICROSCOPIC - Abnormal; Notable for the following components:   Color, Urine STRAW (*)    Hgb urine dipstick LARGE (*)    Ketones, ur 5 (*)    Bacteria, UA RARE (*)    All other components within normal limits  HEMOGLOBIN A1C - Abnormal; Notable for the following components:   Hgb A1c MFr Bld 6.5 (*)    All other components within normal limits  CBG MONITORING,  ED - Abnormal; Notable for the following components:   Glucose-Capillary 64 (*)    All other components within normal limits  I-STAT VENOUS BLOOD GAS, ED - Abnormal; Notable for the following components:   pH, Ven 6.870 (*)    pCO2, Ven 29.6 (*)    pO2, Ven 178 (*)    Bicarbonate 5.4 (*)    TCO2 6 (*)    Acid-base  deficit 28.0 (*)    Sodium 117 (*)    Calcium, Ion 0.95 (*)    All other components within normal limits  I-STAT ARTERIAL BLOOD GAS, ED - Abnormal; Notable for the following components:   pH, Arterial 7.249 (*)    pO2, Arterial 478 (*)    Bicarbonate 16.8 (*)    TCO2 18 (*)    Acid-base deficit 10.0 (*)    Sodium 116 (*)    Calcium, Ion 1.04 (*)    All other components within normal limits  CBG MONITORING, ED - Abnormal; Notable for the following components:   Glucose-Capillary 208 (*)    All other components within normal limits  CBG MONITORING, ED - Abnormal; Notable for the following components:   Glucose-Capillary 196 (*)    All other components within normal limits  TROPONIN I (HIGH SENSITIVITY) - Abnormal; Notable for the following components:   Troponin I (High Sensitivity) 103 (*)    All other components within normal limits  CULTURE, BLOOD (ROUTINE X 2)  CULTURE, BLOOD (ROUTINE X 2)  MAGNESIUM  BLOOD GAS, VENOUS  TRIGLYCERIDES  CK  VITAMIN B1  VITAMIN B12  METHYLMALONIC ACID, SERUM  FOLATE  T4, FREE  TSH  SODIUM  SODIUM  BLOOD GAS, ARTERIAL  HIV ANTIBODY (ROUTINE TESTING W REFLEX)  CBC  MAGNESIUM  PHOSPHORUS  COMPREHENSIVE METABOLIC PANEL  SODIUM  TYPE AND SCREEN  TROPONIN I (HIGH SENSITIVITY)      EKG   EKG Interpretation  Date/Time:  Thursday January 12 2022 18:54:17 EDT Ventricular Rate:  132 PR Interval:  134 QRS Duration: 85 QT Interval:  381 QTC Calculation: 565 R Axis:   94 Text Interpretation: Sinus or ectopic atrial tachycardia Borderline right axis deviation Prolonged QT interval Confirmed by [place] ([id number]) on  01/31/2022 7:20:35 PM         Imaging Studies ordered: I ordered imaging studies including chest x-ray, CT head, CT angio I independently visualized and interpreted imaging. I agree with the radiologist interpretation   Medicines ordered and prescription drug management: Meds ordered this encounter  Medications   DISCONTD: LORazepam (ATIVAN) injection 2 mg   LORazepam (ATIVAN) 2 MG/ML injection    Aretha Parrot, Clara L: cabinet override   LORazepam (ATIVAN) 2 MG/ML injection    Cochrane, Mykenzie E: cabinet override   LORazepam (ATIVAN) 2 MG/ML injection    Cochrane, Mykenzie E: cabinet override   propofol (DIPRIVAN) 1000 MG/100ML infusion   midazolam (VERSED) injection 2 mg   midazolam (VERSED) injection 2 mg   levETIRAcetam (KEPPRA) 2,000 mg in sodium chloride 0.9 % 250 mL IVPB   0.9 %  sodium chloride infusion   DISCONTD: lactated ringers bolus 1,000 mL   LORazepam (ATIVAN) injection 2 mg   sodium bicarbonate injection 50 mEq   insulin aspart (novoLOG) injection 0-9 Units    Order Specific Question:   Correction coverage:    Answer:   Sensitive (thin, NPO, renal)    Order Specific Question:   CBG < 70:    Answer:   Implement Hypoglycemia Standing Orders and refer to Hypoglycemia Standing Orders sidebar report    Order Specific Question:   CBG 70 - 120:    Answer:   0 units    Order Specific Question:   CBG 121 - 150:    Answer:   1 unit    Order Specific Question:   CBG 151 - 200:    Answer:   2 units    Order Specific  Question:   CBG 201 - 250:    Answer:   3 units    Order Specific Question:   CBG 251 - 300:    Answer:   5 units    Order Specific Question:   CBG 301 - 350:    Answer:   7 units    Order Specific Question:   CBG 351 - 400    Answer:   9 units    Order Specific Question:   CBG > 400    Answer:   call MD and obtain STAT lab verification   FOLLOWED BY Linked Order Group    thiamine 500mg  in normal saline (47ml) IVPB    thiamine (B-1) injection 123XX123 mg    folic acid (FOLVITE) tablet 1 mg   multivitamin with minerals tablet 1 tablet   midazolam (VERSED) injection 1-2 mg   midazolam (VERSED) 2 MG/2ML injection    Bertis Ruddy: cabinet override   levETIRAcetam (KEPPRA) 2,000 mg in sodium chloride 0.9 % 250 mL IVPB   iohexol (OMNIPAQUE) 350 MG/ML injection 60 mL   DISCONTD: docusate sodium (COLACE) capsule 100 mg   DISCONTD: polyethylene glycol (MIRALAX / GLYCOLAX) packet 17 g   pantoprazole sodium (PROTONIX) 40 mg/20 mL oral suspension 40 mg   sodium chloride 0.9 % bolus 1,000 mL   docusate (COLACE) 50 MG/5ML liquid 100 mg   polyethylene glycol (MIRALAX / GLYCOLAX) packet 17 g   fentaNYL (SUBLIMAZE) injection 50 mcg   fentaNYL 254mcg in NS 265mL (43mcg/ml) infusion-PREMIX   fentaNYL (SUBLIMAZE) bolus via infusion 50-100 mcg   Ampicillin-Sulbactam (UNASYN) 3 g in sodium chloride 0.9 % 100 mL IVPB    Order Specific Question:   Antibiotic Indication:    Answer:   Aspiration Pneumonia   EPINEPHrine (ADRENALIN) 1 MG/10ML injection   ipratropium-albuterol (DUONEB) 0.5-2.5 (3) MG/3ML nebulizer solution 3 mL   albuterol (PROVENTIL) (2.5 MG/3ML) 0.083% nebulizer solution 2.5 mg    -I have reviewed the patients home medicines and have made adjustments as needed  Critical interventions CPR, management of status epilepticus, bicarb for critical acidemia, intubation, cardioversion  Consultations Obtained: I requested consultation with the intensivist, neurologist, cardiologist , ENT and discussed lab and imaging findings as well as pertinent plan - they recommend: ICU admission, propofol, EEG   Cardiac Monitoring: The patient was maintained on a cardiac monitor.  I personally viewed and interpreted the cardiac monitored which showed an underlying rhythm of: Sinus tachycardia, atrial fibrillation with RVR, atrial tach  Social Determinants of Health:  Factors impacting patients care include: Daily alcohol use   Reevaluation: After the  interventions noted above, I reevaluated the patient and found that they have :improved  Co morbidities that complicate the patient evaluation  Past Medical History:  Diagnosis Date   Diabetes mellitus without complication ([place])    Hypertriglyceridemia 01/30/2022   Paranoid schizophrenia ([place]) 05/26/2017   Tobacco abuse 01/13/2022      Dispostion: I considered admission for this patient, and given multiple critical illnesses, patient will require hospital admission     Final Clinical Impression(s) / ED Diagnoses Final diagnoses:  None     @PCDICTATION @    Teressa Lower, MD 02/02/2022 2306

## 2022-01-12 NOTE — Progress Notes (Signed)
Patient on vent. No room air. Patient is tape and pasted. Head is wrapped and has a stocking cap on.

## 2022-01-12 NOTE — Progress Notes (Signed)
Per RN, patient is going to CT first. She will call when patient is back from imaging.

## 2022-01-12 NOTE — ED Notes (Signed)
Pulse check at this time. No pulses were palpated. Compressions resumed.

## 2022-01-12 NOTE — ED Notes (Signed)
2nd round of epi administered at this time.

## 2022-01-13 ENCOUNTER — Inpatient Hospital Stay (HOSPITAL_COMMUNITY): Payer: Medicare Other

## 2022-01-13 DIAGNOSIS — G40901 Epilepsy, unspecified, not intractable, with status epilepticus: Secondary | ICD-10-CM | POA: Diagnosis not present

## 2022-01-13 DIAGNOSIS — S01552A Open bite of oral cavity, initial encounter: Secondary | ICD-10-CM | POA: Diagnosis not present

## 2022-01-13 DIAGNOSIS — S01512A Laceration without foreign body of oral cavity, initial encounter: Secondary | ICD-10-CM | POA: Diagnosis present

## 2022-01-13 DIAGNOSIS — R569 Unspecified convulsions: Secondary | ICD-10-CM | POA: Diagnosis not present

## 2022-01-13 LAB — MAGNESIUM
Magnesium: 2.5 mg/dL — ABNORMAL HIGH (ref 1.7–2.4)
Magnesium: 2.6 mg/dL — ABNORMAL HIGH (ref 1.7–2.4)
Magnesium: 2.5 mg/dL — ABNORMAL HIGH (ref 1.7–2.4)

## 2022-01-13 LAB — POCT I-STAT 7, (LYTES, BLD GAS, ICA,H+H)
Acid-base deficit: 4 mmol/L — ABNORMAL HIGH (ref 0.0–2.0)
Acid-base deficit: 7 mmol/L — ABNORMAL HIGH (ref 0.0–2.0)
Bicarbonate: 16.6 mmol/L — ABNORMAL LOW (ref 20.0–28.0)
Bicarbonate: 20.9 mmol/L (ref 20.0–28.0)
Calcium, Ion: 1.08 mmol/L — ABNORMAL LOW (ref 1.15–1.40)
Calcium, Ion: 1.23 mmol/L (ref 1.15–1.40)
HCT: 47 % (ref 39.0–52.0)
HCT: 48 % (ref 39.0–52.0)
Hemoglobin: 16 g/dL (ref 13.0–17.0)
Hemoglobin: 16.3 g/dL (ref 13.0–17.0)
O2 Saturation: 98 %
O2 Saturation: 98 %
Patient temperature: 38.6
Potassium: 3.6 mmol/L (ref 3.5–5.1)
Potassium: 3.7 mmol/L (ref 3.5–5.1)
Sodium: 124 mmol/L — ABNORMAL LOW (ref 135–145)
Sodium: 132 mmol/L — ABNORMAL LOW (ref 135–145)
TCO2: 17 mmol/L — ABNORMAL LOW (ref 22–32)
TCO2: 22 mmol/L (ref 22–32)
pCO2 arterial: 31.4 mmHg — ABNORMAL LOW (ref 32–48)
pCO2 arterial: 38.7 mmHg (ref 32–48)
pH, Arterial: 7.34 — ABNORMAL LOW (ref 7.35–7.45)
pH, Arterial: 7.341 — ABNORMAL LOW (ref 7.35–7.45)
pO2, Arterial: 108 mmHg (ref 83–108)
pO2, Arterial: 114 mmHg — ABNORMAL HIGH (ref 83–108)

## 2022-01-13 LAB — BETA-HYDROXYBUTYRIC ACID: Beta-Hydroxybutyric Acid: 0.21 mmol/L (ref 0.05–0.27)

## 2022-01-13 LAB — COMPREHENSIVE METABOLIC PANEL
ALT: 57 U/L — ABNORMAL HIGH (ref 0–44)
AST: 148 U/L — ABNORMAL HIGH (ref 15–41)
Albumin: 3.6 g/dL (ref 3.5–5.0)
Alkaline Phosphatase: 52 U/L (ref 38–126)
Anion gap: 10 (ref 5–15)
BUN: 12 mg/dL (ref 6–20)
CO2: 21 mmol/L — ABNORMAL LOW (ref 22–32)
Calcium: 8.4 mg/dL — ABNORMAL LOW (ref 8.9–10.3)
Chloride: 97 mmol/L — ABNORMAL LOW (ref 98–111)
Creatinine, Ser: 1.13 mg/dL (ref 0.61–1.24)
GFR, Estimated: >60 mL/min (ref >60)
Glucose, Bld: 162 mg/dL — ABNORMAL HIGH (ref 70–99)
Potassium: 4.5 mmol/L (ref 3.5–5.1)
Sodium: 128 mmol/L — ABNORMAL LOW (ref 135–145)
Total Bilirubin: 1.1 mg/dL (ref 0.3–1.2)
Total Protein: 6.8 g/dL (ref 6.5–8.1)

## 2022-01-13 LAB — TYPE AND SCREEN
ABO/RH(D): O POS
Antibody Screen: NEGATIVE

## 2022-01-13 LAB — GLUCOSE, CAPILLARY
Glucose-Capillary: 177 mg/dL — ABNORMAL HIGH (ref 70–99)
Glucose-Capillary: 157 mg/dL — ABNORMAL HIGH (ref 70–99)
Glucose-Capillary: 161 mg/dL — ABNORMAL HIGH (ref 70–99)
Glucose-Capillary: 189 mg/dL — ABNORMAL HIGH (ref 70–99)
Glucose-Capillary: 148 mg/dL — ABNORMAL HIGH (ref 70–99)
Glucose-Capillary: 179 mg/dL — ABNORMAL HIGH (ref 70–99)

## 2022-01-13 LAB — BASIC METABOLIC PANEL
Anion gap: 15 (ref 5–15)
BUN: 11 mg/dL (ref 6–20)
CO2: 20 mmol/L — ABNORMAL LOW (ref 22–32)
Calcium: 8.9 mg/dL (ref 8.9–10.3)
Chloride: 98 mmol/L (ref 98–111)
Creatinine, Ser: 0.93 mg/dL (ref 0.61–1.24)
GFR, Estimated: >60 mL/min (ref >60)
Glucose, Bld: 150 mg/dL — ABNORMAL HIGH (ref 70–99)
Potassium: 3.9 mmol/L (ref 3.5–5.1)
Sodium: 133 mmol/L — ABNORMAL LOW (ref 135–145)

## 2022-01-13 LAB — TRIGLYCERIDES: Triglycerides: 166 mg/dL — ABNORMAL HIGH (ref <150)

## 2022-01-13 LAB — CBC
HCT: 48.2 % (ref 39.0–52.0)
Hemoglobin: 16.4 g/dL (ref 13.0–17.0)
MCH: 29 pg (ref 26.0–34.0)
MCHC: 34 g/dL (ref 30.0–36.0)
MCV: 85.2 fL (ref 80.0–100.0)
Platelets: 208 10*3/uL (ref 150–400)
RBC: 5.66 MIL/uL (ref 4.22–5.81)
RDW: 12.8 % (ref 11.5–15.5)
WBC: 22.9 10*3/uL — ABNORMAL HIGH (ref 4.0–10.5)
nRBC: 0 % (ref 0.0–0.2)

## 2022-01-13 LAB — MRSA NEXT GEN BY PCR, NASAL: MRSA by PCR Next Gen: NOT DETECTED

## 2022-01-13 LAB — VITAMIN B12: Vitamin B-12: 258 pg/mL (ref 180–914)

## 2022-01-13 LAB — T4, FREE: Free T4: 0.94 ng/dL (ref 0.61–1.12)

## 2022-01-13 LAB — PHOSPHORUS
Phosphorus: 3.1 mg/dL (ref 2.5–4.6)
Phosphorus: 2.8 mg/dL (ref 2.5–4.6)
Phosphorus: 3.1 mg/dL (ref 2.5–4.6)

## 2022-01-13 LAB — PROTIME-INR
INR: 1.1 (ref 0.8–1.2)
Prothrombin Time: 14.4 seconds (ref 11.4–15.2)

## 2022-01-13 LAB — FOLATE: Folate: 11.2 ng/mL (ref >5.9)

## 2022-01-13 LAB — TROPONIN I (HIGH SENSITIVITY): Troponin I (High Sensitivity): 217 ng/L (ref <18)

## 2022-01-13 LAB — SALICYLATE LEVEL: Salicylate Lvl: <7 mg/dL — ABNORMAL LOW (ref 7.0–30.0)

## 2022-01-13 LAB — SODIUM
Sodium: 128 mmol/L — ABNORMAL LOW (ref 135–145)
Sodium: 131 mmol/L — ABNORMAL LOW (ref 135–145)

## 2022-01-13 LAB — ABO/RH: ABO/RH(D): O POS

## 2022-01-13 MED ORDER — FUROSEMIDE 10 MG/ML IJ SOLN
20.0000 mg | Freq: Once | INTRAMUSCULAR | Status: AC
  Administered 2022-01-13: 20 mg via INTRAVENOUS
  Filled 2022-01-13: qty 2

## 2022-01-13 MED ORDER — NAPHAZOLINE-PHENIRAMINE 0.025-0.3 % OP SOLN
1.0000 [drp] | Freq: Four times a day (QID) | OPHTHALMIC | Status: AC | PRN
  Administered 2022-01-14: 1 [drp] via OPHTHALMIC
  Filled 2022-01-13: qty 15

## 2022-01-13 MED ORDER — LEVETIRACETAM IN NACL 1000 MG/100ML IV SOLN: 1000.0000 mg | Freq: Once | INTRAVENOUS | Status: DC

## 2022-01-13 MED ORDER — PANTOPRAZOLE 2 MG/ML SUSPENSION
40.0000 mg | Freq: Every day | ORAL | Status: DC
  Administered 2022-01-13 – 2022-01-25 (×13): 40 mg
  Filled 2022-01-13 (×13): qty 20

## 2022-01-13 MED ORDER — ALTEPLASE 2 MG IJ SOLR
INTRAMUSCULAR | Status: AC
  Administered 2022-01-13: 2 mg
  Filled 2022-01-13: qty 2

## 2022-01-13 MED ORDER — LEVETIRACETAM IN NACL 500 MG/100ML IV SOLN
500.0000 mg | Freq: Two times a day (BID) | INTRAVENOUS | Status: DC
Start: 2022-01-13 — End: 2022-01-18
  Administered 2022-01-13 – 2022-01-18 (×10): 500 mg via INTRAVENOUS
  Filled 2022-01-13 (×10): qty 100

## 2022-01-13 MED ORDER — LORAZEPAM 2 MG/ML IJ SOLN
2.0000 mg | INTRAMUSCULAR | Status: DC | PRN
  Filled 2022-01-13: qty 1

## 2022-01-13 MED ORDER — CHLORHEXIDINE GLUCONATE CLOTH 2 % EX PADS
6.0000 | MEDICATED_PAD | Freq: Every day | CUTANEOUS | Status: DC
  Administered 2022-01-13 – 2022-01-25 (×17): 6 via TOPICAL

## 2022-01-13 MED ORDER — DOCUSATE SODIUM 50 MG/5ML PO LIQD
100.0000 mg | Freq: Two times a day (BID) | ORAL | Status: DC
  Administered 2022-01-13 – 2022-01-14 (×4): 100 mg
  Filled 2022-01-13 (×4): qty 10

## 2022-01-13 MED ORDER — DEXTROSE 5 % IV SOLN: INTRAVENOUS | Status: DC

## 2022-01-13 MED ORDER — VECURONIUM BROMIDE 10 MG IV SOLR
10.0000 mg | Freq: Once | INTRAVENOUS | Status: DC
  Filled 2022-01-13: qty 10

## 2022-01-13 MED ORDER — FREE WATER
100.0000 mL | Status: DC
Start: 2022-01-13 — End: 2022-01-21
  Administered 2022-01-13 – 2022-01-21 (×44): 100 mL

## 2022-01-13 MED ORDER — CHLORHEXIDINE GLUCONATE 0.12% ORAL RINSE (MEDLINE KIT)
15.0000 mL | Freq: Two times a day (BID) | OROMUCOSAL | Status: DC
  Administered 2022-01-13: 15 mL via OROMUCOSAL

## 2022-01-13 MED ORDER — LIDOCAINE-EPINEPHRINE 1 %-1:100000 IJ SOLN
20.0000 mL | Freq: Once | INTRAMUSCULAR | Status: AC
  Administered 2022-01-13: 20 mL via INTRADERMAL
  Filled 2022-01-13: qty 1

## 2022-01-13 MED ORDER — VECURONIUM BROMIDE 10 MG IV SOLR
INTRAVENOUS | Status: AC
  Administered 2022-01-13: 10 mg via INTRAVENOUS
  Filled 2022-01-13: qty 10

## 2022-01-13 MED ORDER — PROSOURCE TF PO LIQD
45.0000 mL | Freq: Three times a day (TID) | ORAL | Status: DC
  Administered 2022-01-13 – 2022-01-18 (×15): 45 mL
  Filled 2022-01-13 (×15): qty 45

## 2022-01-13 MED ORDER — ORAL CARE MOUTH RINSE
15.0000 mL | OROMUCOSAL | Status: DC
  Administered 2022-01-13 – 2022-01-24 (×90): 15 mL via OROMUCOSAL

## 2022-01-13 MED ORDER — FOLIC ACID 1 MG PO TABS
1.0000 mg | ORAL_TABLET | Freq: Every day | ORAL | Status: DC
  Administered 2022-01-13 – 2022-01-20 (×8): 1 mg
  Filled 2022-01-13 (×8): qty 1

## 2022-01-13 MED ORDER — POLYETHYLENE GLYCOL 3350 17 G PO PACK
17.0000 g | PACK | Freq: Every day | ORAL | Status: DC
  Administered 2022-01-13 – 2022-01-20 (×7): 17 g
  Filled 2022-01-13 (×7): qty 1

## 2022-01-13 MED ORDER — VECURONIUM BROMIDE 10 MG IV SOLR
10.0000 mg | Freq: Once | INTRAVENOUS | Status: AC
Start: 2022-01-13 — End: 2022-01-13

## 2022-01-13 MED ORDER — ALTEPLASE 2 MG IJ SOLR: 2.0000 mg | Freq: Once | INTRAMUSCULAR | Status: AC

## 2022-01-13 MED ORDER — CHLORHEXIDINE GLUCONATE 0.12% ORAL RINSE (MEDLINE KIT)
15.0000 mL | Freq: Four times a day (QID) | OROMUCOSAL | Status: DC
  Administered 2022-01-13 – 2022-01-24 (×36): 15 mL via OROMUCOSAL

## 2022-01-13 MED ORDER — ADULT MULTIVITAMIN W/MINERALS CH
1.0000 | ORAL_TABLET | Freq: Every day | ORAL | Status: DC
  Administered 2022-01-13 – 2022-01-20 (×8): 1
  Filled 2022-01-13 (×8): qty 1

## 2022-01-13 MED ORDER — VITAL 1.5 CAL PO LIQD
1000.0000 mL | ORAL | Status: DC
  Administered 2022-01-13 – 2022-01-17 (×6): 1000 mL
  Filled 2022-01-13: qty 1000

## 2022-01-13 MED ORDER — AMLODIPINE BESYLATE 5 MG PO TABS
5.0000 mg | ORAL_TABLET | Freq: Every day | ORAL | Status: DC
Start: 2022-01-13 — End: 2022-01-14
  Administered 2022-01-13 – 2022-01-14 (×2): 5 mg
  Filled 2022-01-13 (×2): qty 1

## 2022-01-13 MED ORDER — SODIUM CHLORIDE 0.45 % IV SOLN: INTRAVENOUS | Status: DC

## 2022-01-13 NOTE — Progress Notes (Signed)
PM maint. Complete.  °

## 2022-01-13 NOTE — Progress Notes (Signed)
Subjective: He had some vent dyssynchrony overnight and is on sedation.  Exam: Vitals:   01/13/22 1900 01/13/22 1954  BP:  (!) 127/95  Pulse:    Resp:    Temp: 98.6 F (37 C)   SpO2:     Gen: In bed, NAD Resp: non-labored breathing, no acute distress Abd: soft, nt  Neuro: MS: Does not open eyes or follow commands CN: Pupils equal round and reactive, corneals are intact Motor: Minimal flexion versus withdrawal in all four extremities Sensory: As above  EEG is fairly suppressed  Pertinent Labs: BMP is unremarkable  Impression: 41 year old male status post cardiac arrest after seizure.  Given the suppression on EEG, I do have some concerns about his neurological status, but do not think I have enough evidence to, definitively at this time.  I would favor continuing EEG as we lighten sedation, and repeating head CT tomorrow.  Recommendations: 1) continue Keppra 500 mg twice daily for now 2) repeat head CT in the morning 3) continue EEG for now  This patient is critically ill and at significant risk of neurological worsening, death and care requires constant monitoring of vital signs, hemodynamics,respiratory and cardiac monitoring, neurological assessment, discussion with family, other specialists and medical decision making of high complexity. I spent 35 minutes of neurocritical care time  in the care of  this patient. This was time spent independent of any time provided by nurse practitioner or PA.  Ritta Slot, MD [facility] [phone]  If 7pm- 7am, please page neurology on call as listed in AMION. 01/13/2022  7:57 PM

## 2022-01-13 NOTE — Consult Note (Signed)
CC: tongue bite with seizure     Nicholas Carson is an 41 y.o. male.  HPI: 41 year old bit tongue during seizure and had active bleeding.  At the time of my exam the patient was actively clenching his teeth and shaking his head but bleeding had slowed down or stopped.       No Known Allergies   @MEDCOMM @        Past Medical History:  Diagnosis Date   Diabetes mellitus without complication ([place])     Hypertriglyceridemia 01/17/2022   Paranoid schizophrenia ([place]) 05/26/2017   Tobacco abuse 01/08/2022      No past surgical history on file.   No family history on file.   Social History       Social History Narrative   Not on file      Review of Systems General: Denies fevers, chills, weight loss CV: Denies chest pain, shortness of breath, palpitations     Physical Exam     01/08/2022    9:39 PM 01/30/2022    9:22 PM 01/27/2022    9:20 PM  Vitals with BMI  Systolic [id number] 96 90  Diastolic [id number] 69 36  Pulse 145 148 148    General:  No acute distress,  Alert and oriented, Non-Toxic, Normal speech and affect Heent:  intubated, clenching teeth and shaking head, no active bleeding noted.     Assessment/Plan Significant tongue bite.  Bleeding has slowed down or stopped.  If bleeds again then please pack with trauma guaze.  Will reevaluate for repair when patient is less agitated.       Lennice Sites 01/14/2022, 10:38 PM

## 2022-01-13 NOTE — Progress Notes (Signed)
Initial Nutrition Assessment  DOCUMENTATION CODES:   Not applicable  INTERVENTION:   Initiate tube feeds via Cortrak tube: - Start Vital 1.5 @ 20 ml/hr and advance by 10 ml q 6 hours to goal rate of 60 ml/hr (1440 ml/day) - ProSource TF 45 ml TID  Tube feeding regimen at goal rate provides 2280 kcal, 130 grams of protein, and 1100 ml of H2O.  Monitor magnesium, potassium, and phosphorus BID for at least 3 days, MD to replete as needed, as pt is at risk for refeeding syndrome given EtOH abuse.  - Continue MVI with minerals daily per tube  NUTRITION DIAGNOSIS:   Inadequate oral intake related to inability to eat as evidenced by NPO status.  GOAL:   Patient will meet greater than or equal to 90% of their needs  MONITOR:   Vent status, Labs, Weight trends, TF tolerance  REASON FOR ASSESSMENT:   Ventilator, Consult Enteral/tube feeding initiation and management  ASSESSMENT:   41 year old male who presented to the ED on 6/08 after being found unresponsive by family with seizure-like activity. Pt experienced cardiac arrest in the ED after severing tongue with copious bleeding and required intubation. PMH of paranoid schizophrenia, medication noncompliance, T2DM, asthma, EtOH abuse (at least 80 ounces of beer daily), marijuana abuse, tobacco abuse. Pt admitted with acute respiratory failure post-seizure and post-arrest, new-onset seizures, AKI.  06/09 - Cortrak placed (tip gastric)   Consult received for enteral nutrition initiation and management. Pt without enteral access so Cortrak placed this morning, tip gastric per abdominal x-ray. Unable to obtain diet and weight history at this time. Weight history in chart is limited. Last available weight PTA is from 05/25/17. Weight down about 17 kg since that time.  Patient is currently intubated on ventilator support MV: 18 L/min Temp (24hrs), Avg:99.5 F (37.5 C), Min:98.1 F (36.7 C), Max:101.6 F (38.7 C)  Drips: Propofol:  29.7 ml/hr (provides 784 kcal daily from lipid) Fentanyl 1/2NS: 50 ml/hr  Medications reviewed and include: colace, folic acid, SSI q 4 hours, MVI with minerals, protonix, miralax, IV thiamine 500 mg q 8 hours, IV abx   Labs reviewed: sodium 128, ionized calcium 1.08, magnesium 2.5, elevated LFTs, TG 166, WBC 22.9, hemoglobin A1C 6.5 CBG's: 64-208 x 24 hours  UOP: 2725 ml x 24 hours I/O's: -1.0 L since admit  NUTRITION - FOCUSED PHYSICAL EXAM:  Flowsheet Row Most Recent Value  Orbital Region No depletion  Upper Arm Region No depletion  Thoracic and Lumbar Region No depletion  Buccal Region Unable to assess  Temple Region No depletion  Clavicle Bone Region Mild depletion  Clavicle and Acromion Bone Region Mild depletion  Scapular Bone Region No depletion  Dorsal Hand No depletion  Patellar Region No depletion  Anterior Thigh Region Mild depletion  Posterior Calf Region Mild depletion  Edema (RD Assessment) None  Hair Reviewed  Eyes Reviewed  Mouth Reviewed  Skin Reviewed  Nails Reviewed       Diet Order:   Diet Order             Diet NPO time specified  Diet effective now                   EDUCATION NEEDS:   No education needs have been identified at this time  Skin:  Skin Assessment: Reviewed RN Assessment  Last BM:  01/13/22  Height:   Ht Readings from Last 1 Encounters:  01-20-2022 6\' 1"  (1.854 m)    Weight:  Wt Readings from Last 1 Encounters:  01/13/22 111 kg    Ideal Body Weight:  83.6 kg  BMI:  Body mass index is 32.29 kg/m.  Estimated Nutritional Needs:   Kcal:  2200-2400  Protein:  130-150 grams  Fluid:  >2.0 L    Mertie Clause, MS, RD, LDN Inpatient Clinical Dietitian Please see AMiON for contact information.

## 2022-01-13 NOTE — Progress Notes (Addendum)
NAME:  Nicholas Carson, MRN:  [id number], DOB:  [birth year], LOS: 1 ADMISSION DATE:  01/29/2022 CONSULTATION DATE:  01/26/2022 REFERRING MD:  [place] - EDP CHIEF COMPLAINT:  Seizures  History of Present Illness:  41 year old man who presented [facility] ED 6/[facility] via EMS for seizures. PMHx significant for T2DM (uncontrolled), hypertriglyceridemia, schizophrenia, polysubstance abuse (EtOH, THC, tobacco abuse).  Patient had been drinking with family (two 40oz beers) and became unresponsive. EMS called. On arrival witnessed seizure x 1 minute, IM Versed administered. Tongue laceration sustained during seizure. Patient had witnessed cardiac arrest in ED, received 20 minutes CPR, Epi x 2 with ROSC. Intubated and femoral CVC placed. Labs notable for WBC 22.9, Hgb 15.1 (baseline), Na 119, K 4.2, Bicarb < 7, glucose 232, Cr 1.30 (baseline 0.9), mild transaminitis AST 109, ALT 46. Trop 103. Ethanol level 10, UDS +THC and benzos (administered by EMS). CXR unremarkable, CT Head/Maxillofacial without acute abnormalities; CTA Head/Neck negative. Neurology consulted for seizures; AEDs/EEG ordered. ENT consulted for tongue laceration.  PCCM consulted for ICU admission.  Pertinent Medical History:   Past Medical History:  Diagnosis Date   Diabetes mellitus without complication ([place])    Hypertriglyceridemia 01/27/2022   Paranoid schizophrenia ([place]) 05/26/2017   Tobacco abuse 01/26/2022   Significant Hospital Events: Including procedures, antibiotic start and stop dates in addition to other pertinent events   6/8 - Presented to [facility] ED via EMS for seizures, tongue laceration. Coded in ED with CPR x 20 mins and Epi x 2 with subsequent ROSC. CT Head/Maxillofacial negative for acute abnormalities. CTA Head/Neck negative. Neuro consulted. ENT consulted for tongue lac.  Interim History / Subjective:  No events   Objective:  Blood pressure (!) 134/97, pulse (!) 118, temperature 98.2 F (36.8 C), resp. rate (!) 28, height 6\' 1"   (1.854 m), weight 111 kg, SpO2 98 %. CVP:  [11 mmHg-20 mmHg] 11 mmHg  Vent Mode: PRVC FiO2 (%):  [40 %-100 %] 40 % Set Rate:  [18 bmp-28 bmp] 28 bmp Vt Set:  [640 mL] 640 mL PEEP:  [5 cmH20] 5 cmH20 Plateau Pressure:  [14 cmH20-22 cmH20] 17 cmH20   Intake/Output Summary (Last 24 hours) at 01/13/2022 0839 Last data filed at 01/13/2022 0600 Gross per 24 hour  Intake 1514.12 ml  Output 2725 ml  Net -1210.88 ml   Filed Weights   02/03/2022 1900 01/23/2022 2323 01/13/22 0409  Weight: 90 kg 111 kg 111 kg   Physical Examination: General:  critically ill adult male sedated on MV HEENT: MM pink/moist, ETT/ no ogt, currently  teeth clinched on tongue/ ETT, swelling and protrusion of tongue more to left- unable to visualize lac, no active bleeding, increased oral secretions, pupils 2-3/ sluggish, more brisk right corneal, left present, some scleral edema, cEEG in place  Neuro: sedated, propofol paused> withdrawals in LE, ? Withdrawal vs flex in UEs CV: ST, no murmur PULM:  MV supported breaths, synchronous, clear, diminished in bases GI: soft, bs+, foley  w/ cyu Extremities: warm/dry, no LE edema  Skin: no rashes   Labs>  Na 119 on admit> now 128 K 4.5 Bicarb 21 sCr 1.13 WBC 22.9 Triglycerides 166 Normal beta-hydroxybutyric acid CK 7232   Resolved Hospital Problem List:    Assessment & Plan:  Status epilepticus, likely associated with EtOH abuse/hyponatremia Encephalopathy> multifactorial> toxic/metabolic, postictal, some concern for anoxic following cardiac arrest  Presented via EMS for unresponsiveness, then witnessed full body seizures. Versed 5mg  IM given en route. Tongue lac (as below). CT Head/Maxillofacial negative.  CTA Head/Neck WNL. - Neuro following, appreciate recommendations - further imaging per Neuro - cEEG - AEDs per neuro> keppra - Maintain neuro protective measures; goal for eurothermia, euglycemia, eunatermia, normoxia, and PCO2 goal of 35-40 - Nutrition and bowel  regiment > place cortrak today and start TF - Seizure precautions  - Na correction as below - Consider LP if febrile  - defer neuro prognostication at this time   Witnessed cardiac arrest with ROSC Cardiac arrest in ED after seizure, witnessed by EDP. CPR x 20 minutes. Per Cards, rhythm NOT Afib, sinus tach with PACs - troponin hs 103> 635, repeat now, likely 2/2 to CPR  - remains hemodynamically stable, goal MAP > 65 - continue normothermia protocol  - follow cultures, cont unasyn    Acute hypoxemic respiratory failure in the setting of seizure, cardiac arrest Possible aspiration pneumonia Difficult airway on intubation - cont MV support, 8cc/kg IBW with goal Pplat <30 and DP<15  -VAP prevention protocol/ PPI -PAD protocol for sedation> propofol and fentanyl gtt for RASS goal -1/-2, vent synchrony, and to prevent further tongue biting/ teeth clenching.  Scheduled bowel regimen when cortrak placed  -wean FiO2 as able for SpO2 >92%  -daily SAT & SBT> does not meet criteria for weaning - cont unasyn for possible aspiration coverage   Tongue laceration with acute blood loss - ENT consulted for evaluation, possible repair - Hgb remains stable and no active bleeding thus far this am - bite block inserted this am to take pressure off tongue to prevent further injury and swelling - Unasyn for antibiotic coverage   AKI Hyponatremia Rhabdomyolysis  - sCr remains WNL and UOP is great, 2.7L overnight - starting 1/2NS at 50 ml/hr.  Free water deficit remains  -6.2L but not to further overcorrect Na at this time.  Corrected 8 meq in 12hrs after 1L NS and with the sodium in unasyn.  Avoid overcorrection to prevent CMP - strict q4 Na checks  - recheck CK in am  - Replete electrolytes as indicated - Monitor I&Os - Avoid nephrotoxic agents as able - Ensure adequate renal perfusion    EtOH abuse with possible withdrawal - Monitor for signs/symptoms of withdrawal.  Mother states was  drinking ETOH the day of seizures.  - remains on propofol for now - Consider Phenobarbital taper - Continue Versed PRN - Continue thiamine - MV when cortrak placed     Tobacco abuse THC abuse - Encourage cessation when able to participate in care   Hypertriglyceridemia Home meds: Tricor - Hold Tricor at present    T2DM, poorly controlled A1C 6.5%. Home meds: Janumet-XR 50-1000. - SSI sensitive  - CBGs Q4H    History of schizophrenia No home medications in chart.   Best Practice: (right click and "Reselect all SmartList Selections" daily)   Diet/type: NPO w/ meds via tube- cortrak ordered  DVT prophylaxis: SCDs, hold AC in setting of tongue lac GI prophylaxis: PPI Lines: Central line Foley:  Yes, and it is still needed Code Status:  full code Last date of multidisciplinary goals of care discussion [Pending]  Mother, [name] updated at bedside.   Labs:  CBC: Recent Labs  Lab 01/21/2022 1928 01/11/2022 1945 01/25/2022 2124 01/25/2022 2351 01/13/22 0010 01/13/22 0354  WBC 22.9*  --   --   --   --  22.9*  NEUTROABS 17.1*  --   --   --   --   --   HGB 15.1 17.0 16.3 16.3 16.0 16.4  HCT  47.0 50.0 48.0 48.0 47.0 48.2  MCV 92.9  --   --   --   --  85.2  PLT 223  --   --   --   --  [id number]   Basic Metabolic Panel: Recent Labs  Lab 01/26/2022 1928 01/16/2022 1945 01/15/2022 2124 01/16/2022 2135 01/19/2022 2351 01/13/22 0010 01/13/22 0354 01/13/22 0726  NA 119* 117* 116* 118* 123* 124* 128* 128*  K 4.2 4.2 4.3  --  3.8 3.6 4.5  --   CL 85*  --   --   --   --   --  97*  --   CO2 <7*  --   --   --   --   --  21*  --   GLUCOSE 232*  --   --   --   --   --  162*  --   BUN 10  --   --   --   --   --  12  --   CREATININE 1.30*  --   --   --   --   --  1.13  --   CALCIUM 7.5*  --   --   --   --   --  8.4*  --   MG 2.2  --   --   --   --   --  2.5*  --   PHOS  --   --   --   --   --   --  3.1  --    GFR: Estimated Creatinine Clearance: 111.2 mL/min (by C-G formula based on SCr of  1.13 mg/dL). Recent Labs  Lab 01/07/2022 1928 01/13/22 0354  WBC 22.9* 22.9*   Liver Function Tests: Recent Labs  Lab 01/27/2022 1928 01/13/22 0354  AST 109* 148*  ALT 46* 57*  ALKPHOS 61 52  BILITOT 0.8 1.1  PROT 5.8* 6.8  ALBUMIN 3.0* 3.6   No results for input(s): "LIPASE", "AMYLASE" in the last 168 hours. No results for input(s): "AMMONIA" in the last 168 hours.  ABG:    Component Value Date/Time   PHART 7.340 (L) 01/13/2022 0010   PCO2ART 31.4 (L) 01/13/2022 0010   PO2ART 114 (H) 01/13/2022 0010   HCO3 16.6 (L) 01/13/2022 0010   TCO2 17 (L) 01/13/2022 0010   ACIDBASEDEF 7.0 (H) 01/13/2022 0010   O2SAT 98 01/13/2022 0010    Coagulation Profile: Recent Labs  Lab 01/30/2022 2334  INR 1.1    Cardiac Enzymes: Recent Labs  Lab 01/10/2022 2135  CKTOTAL 7,232*    HbA1C: Hgb A1c MFr Bld  Date/Time Value Ref Range Status  01/16/2022 09:35 PM 6.5 (H) 4.8 - 5.6 % Final    Comment:    (NOTE) Pre diabetes:          5.7%-6.4%  Diabetes:              >6.4%  Glycemic control for   <7.0% adults with diabetes   08/11/2021 11:05 AM 6.0 (H) 4.8 - 5.6 % Final    Comment:    (NOTE) Pre diabetes:          5.7%-6.4%  Diabetes:              >6.4%  Glycemic control for   <7.0% adults with diabetes    CBG: Recent Labs  Lab 01/18/2022 2201 01/20/2022 2203 01/20/2022 2207 01/14/2022 2353 01/13/22 0513  GLUCAP 64* 208* 196* 175* 177*     Critical care time:  30 minutes      Kennieth Rad, ACNP [facility] 01/13/2022, 8:40 AM  See Amion for pager If no response to pager, please call PCCM consult pager After 7:00 pm call Elink

## 2022-01-13 NOTE — Assessment & Plan Note (Addendum)
No bleeding at present.   - Continue conservative management - Attempt to reduce the tongue

## 2022-01-13 NOTE — Procedures (Signed)
Patient Name: Nicholas Carson  MRN: [id number]  Epilepsy Attending: Charlsie Quest  Referring Physician/Provider: Gordy Councilman, MD Date: 01/25/2022 Duration: 24.14 mins  Patient history: 41 year old male with presented with epilepticus.  EEG to evaluate for seizure.  Level of alertness: comatose  AEDs during EEG study: Propofol  Technical aspects: This EEG study was done with scalp electrodes positioned according to the 10-20 International system of electrode placement. Electrical activity was acquired at a sampling rate of 500Hz  and reviewed with a high frequency filter of 70Hz  and a low frequency filter of 1Hz . EEG data were recorded continuously and digitally stored.   Description: EEG showed continuous generalized background attenuation.  EEG was reactive to tactile stimulation. Hyperventilation and photic stimulation were not performed.     ABNORMALITY -Background attenuation, generalized  IMPRESSION: This study is suggestive of profound diffuse encephalopathy, nonspecific etiology.  No seizures or epileptiform discharges were seen throughout the recording.  [name] 

## 2022-01-13 NOTE — Assessment & Plan Note (Addendum)
Etiology unclear. Patient was not in alcohol withdrawal when seized Seizures were difficult to control in ED EEG in now generally isoelectric..  CT head negative.   - Stop sedation today s - Monitor for recurrent EEG and clinical seizures.  - Start Keppra for seizure prevention.  - fentanyl and precedex if becomes agitated

## 2022-01-13 NOTE — Assessment & Plan Note (Signed)
Treating possible aspiration.  Nominal ventilator settings.   - Mental status likely to drive timing of extubation.

## 2022-01-13 NOTE — Procedures (Signed)
Cortrak  Person Inserting Tube:  Sabian Kuba D, RD Tube Type:  Cortrak - 43 inches Tube Size:  10 Tube Location:  Left nare Secured by: Bridle Technique Used to Measure Tube Placement:  Marking at nare/corner of mouth Cortrak Secured At:  68 cm  Cortrak Tube Team Note:  Consult received to place a Cortrak feeding tube.   X-ray is required, abdominal x-ray has been ordered by the Cortrak team. Please confirm tube placement before using the Cortrak tube.   If the tube becomes dislodged please keep the tube and contact the Cortrak team at [email or web address] ([email or web address]) for replacement.  If after hours and replacement cannot be delayed, place a NG tube and confirm placement with an abdominal x-ray.    Tip Atienza, RD, LDN Clinical Dietitian RD pager # available in AMION  After hours/weekend pager # available in AMION  

## 2022-01-13 NOTE — Progress Notes (Signed)
CSW acknowledges consult for substance use. CSW will complete assessment when appropriate. CSW will continue to follow.

## 2022-01-13 NOTE — Assessment & Plan Note (Signed)
-  Cessation counseling once extubated.

## 2022-01-13 NOTE — Procedures (Signed)
Patient Name: Nicholas Carson  MRN: [id number]  Epilepsy Attending: Charlsie Quest  Referring Physician/Provider: Gordy Councilman, MD Duration: 01-31-22 2228 to 01/13/2022 2228   Patient history: 41 year old male with presented with epilepticus.  EEG to evaluate for seizure.   Level of alertness: comatose   AEDs during EEG study: Propofol, LEV   Technical aspects: This EEG study was done with scalp electrodes positioned according to the 10-20 International system of electrode placement. Electrical activity was acquired at a sampling rate of 500Hz  and reviewed with a high frequency filter of 70Hz  and a low frequency filter of 1Hz . EEG data were recorded continuously and digitally stored.    Description: EEG showed continuous generalized background suppression.  EEG was reactive to tactile stimulation. Hyperventilation and photic stimulation were not performed.     Event button was pressed on 01/13/2022 at 0232.  Per RN, patient was shaking which was difficult to visualize on camera.  Concomitant EEG showed significant myogenic artifact.  After filtering for artifact, no EEG change was seen to suggest seizure.   ABNORMALITY -Background suppression, generalized   IMPRESSION: This study is suggestive of profound diffuse encephalopathy, nonspecific etiology.  No seizures or epileptiform discharges were seen throughout the recording.  One event was recorded on 01/13/2022 at 0232 during which patient was shaking per RN without concomitant EEG change.  This was most likely not an epileptic event.   Nicholas Carson 

## 2022-01-13 NOTE — Progress Notes (Signed)
eLink Physician-Brief Progress Note Patient Name: Nicholas Carson DOB: 09-02-1980 MRN: [id number]   Date of Service  01/13/2022  HPI/Events of Note  Patient with a serum sodium of 133 and D 5 % W  gtt at 50 ml / hour, he has also been tachycardic but his CVP is 13 (r/o hypervolemia),  he has an injected  conjunctiva.  eICU Interventions  D 5 % W gtt discontinued and patient given Lasix 20 mg iv x 1 to hopefully address hypervolemia and increase the serum sodium. Visine drops ordered for injected conjunctiva.        Thomasene Lot [name] 01/13/2022, 9:53 PM

## 2022-01-13 NOTE — Op Note (Signed)
Operative Note   DATE OF OPERATION: 01/13/2022  SURGICAL DEPARTMENT: Plastic Surgery  PREOPERATIVE DIAGNOSES:  tongue laceration  POSTOPERATIVE DIAGNOSES:  same  PROCEDURE:  repair of 5 cm tongue laceration  SURGEON: Jathniel P. Kyley Laurel, MD  ASSISTANT: none  ANESTHESIA:  General.   COMPLICATIONS: None.   INDICATIONS FOR PROCEDURE:  The patient, Nicholas Carson is a 41 y.o. male born on [birth year], is here for treatment of tongue laceration.  The laceration was deep and would likely heal better with some repair.  Bleeding had stopped but repair was still indicated. MRN: [id number]  CONSENT:  Informed consent was obtained directly from the patient. Risks, benefits and alternatives were fully discussed. Specific risks including but not limited to bleeding, infection, hematoma, seroma, scarring, pain, contracture, asymmetry, wound healing problems, and need for further surgery were all discussed. The patient did have an ample opportunity to have questions answered to satisfaction.   DESCRIPTION OF PROCEDURE:   The patient's operative site was prepped and draped in a sterile fashion. A time out was performed and all information was confirmed to be correct.  5 3-0 vicryl sutures were placed in the tongue due to a significant 5 cm deep laceration.  The patient tolerated the procedure well.  There were no complications. The patient was allowed to wake from anesthesia, extubated and taken to the recovery room in satisfactory condition.

## 2022-01-13 NOTE — Progress Notes (Signed)
   01/13/22 1015  Clinical Encounter Type  Visited With Patient not available  Visit Type Follow-up;Critical Care  Referral From Chaplain Agustin Cree)  Consult/Referral To Chaplain  Recommendations Patient on Vent/NoFamily Present   Follow-up Visit. No Family present at this time. [address] [place], Aletha Halim., [phone]

## 2022-01-13 NOTE — Progress Notes (Signed)
[facility] Progress Note Patient Name: Nicholas Carson DOB: 04/24/1981 MRN: [id number]   Date of Service  01/13/2022  HPI/Events of Note  Seizure - BP = 163/117 with MAP = 128 and HR = 127.  Currently on Propofol at 50 mcg/kg/min. Plenty of room from BP and HR point of view to titrate up Propofol.  eICU Interventions  Plan: Increase ceiling on Propofol IV infusion to 80 mcg/kg/min.     Intervention Category Major Interventions: Seizures - evaluation and management  Kerline Trahan Eugene 01/13/2022, 3:08 AM

## 2022-01-14 ENCOUNTER — Inpatient Hospital Stay (HOSPITAL_COMMUNITY): Payer: Medicare Other

## 2022-01-14 DIAGNOSIS — J9601 Acute respiratory failure with hypoxia: Secondary | ICD-10-CM | POA: Diagnosis not present

## 2022-01-14 DIAGNOSIS — G40901 Epilepsy, unspecified, not intractable, with status epilepticus: Secondary | ICD-10-CM | POA: Diagnosis not present

## 2022-01-14 LAB — POCT I-STAT 7, (LYTES, BLD GAS, ICA,H+H)
Acid-base deficit: 3 mmol/L — ABNORMAL HIGH (ref 0.0–2.0)
Bicarbonate: 22.6 mmol/L (ref 20.0–28.0)
Calcium, Ion: 1.27 mmol/L (ref 1.15–1.40)
HCT: 45 % (ref 39.0–52.0)
Hemoglobin: 15.3 g/dL (ref 13.0–17.0)
O2 Saturation: 97 %
Patient temperature: 37
Potassium: 3.6 mmol/L (ref 3.5–5.1)
Sodium: 131 mmol/L — ABNORMAL LOW (ref 135–145)
TCO2: 24 mmol/L (ref 22–32)
pCO2 arterial: 39.6 mmHg (ref 32–48)
pH, Arterial: 7.364 (ref 7.35–7.45)
pO2, Arterial: 97 mmHg (ref 83–108)

## 2022-01-14 LAB — CBC
HCT: 45 % (ref 39.0–52.0)
Hemoglobin: 15.6 g/dL (ref 13.0–17.0)
MCH: 29.6 pg (ref 26.0–34.0)
MCHC: 34.7 g/dL (ref 30.0–36.0)
MCV: 85.4 fL (ref 80.0–100.0)
Platelets: 181 10*3/uL (ref 150–400)
RBC: 5.27 MIL/uL (ref 4.22–5.81)
RDW: 13.3 % (ref 11.5–15.5)
WBC: 18.7 10*3/uL — ABNORMAL HIGH (ref 4.0–10.5)
nRBC: 0 % (ref 0.0–0.2)

## 2022-01-14 LAB — COMPREHENSIVE METABOLIC PANEL
ALT: 52 U/L — ABNORMAL HIGH (ref 0–44)
AST: 108 U/L — ABNORMAL HIGH (ref 15–41)
Albumin: 3.1 g/dL — ABNORMAL LOW (ref 3.5–5.0)
Alkaline Phosphatase: 53 U/L (ref 38–126)
Anion gap: 10 (ref 5–15)
BUN: 7 mg/dL (ref 6–20)
CO2: 24 mmol/L (ref 22–32)
Calcium: 9.1 mg/dL (ref 8.9–10.3)
Chloride: 100 mmol/L (ref 98–111)
Creatinine, Ser: 0.85 mg/dL (ref 0.61–1.24)
GFR, Estimated: >60 mL/min (ref >60)
Glucose, Bld: 210 mg/dL — ABNORMAL HIGH (ref 70–99)
Potassium: 3.8 mmol/L (ref 3.5–5.1)
Sodium: 134 mmol/L — ABNORMAL LOW (ref 135–145)
Total Bilirubin: 0.4 mg/dL (ref 0.3–1.2)
Total Protein: 6.4 g/dL — ABNORMAL LOW (ref 6.5–8.1)

## 2022-01-14 LAB — MAGNESIUM
Magnesium: 2.1 mg/dL (ref 1.7–2.4)
Magnesium: 1.8 mg/dL (ref 1.7–2.4)

## 2022-01-14 LAB — PHOSPHORUS
Phosphorus: 2.8 mg/dL (ref 2.5–4.6)
Phosphorus: 2.9 mg/dL (ref 2.5–4.6)

## 2022-01-14 LAB — GLUCOSE, CAPILLARY
Glucose-Capillary: 178 mg/dL — ABNORMAL HIGH (ref 70–99)
Glucose-Capillary: 162 mg/dL — ABNORMAL HIGH (ref 70–99)
Glucose-Capillary: 201 mg/dL — ABNORMAL HIGH (ref 70–99)
Glucose-Capillary: 173 mg/dL — ABNORMAL HIGH (ref 70–99)
Glucose-Capillary: 188 mg/dL — ABNORMAL HIGH (ref 70–99)
Glucose-Capillary: 227 mg/dL — ABNORMAL HIGH (ref 70–99)

## 2022-01-14 LAB — CK: Total CK: 9719 U/L — ABNORMAL HIGH (ref 49–397)

## 2022-01-14 MED ORDER — AMLODIPINE BESYLATE 10 MG PO TABS
10.0000 mg | ORAL_TABLET | Freq: Every day | ORAL | Status: DC
Start: 2022-01-15 — End: 2022-01-21
  Administered 2022-01-15 – 2022-01-20 (×6): 10 mg
  Filled 2022-01-14 (×6): qty 1

## 2022-01-14 MED ORDER — AMLODIPINE BESYLATE 5 MG PO TABS
5.0000 mg | ORAL_TABLET | Freq: Once | ORAL | Status: AC
  Administered 2022-01-14: 5 mg
  Filled 2022-01-14: qty 1

## 2022-01-14 MED ORDER — DEXMEDETOMIDINE HCL IN NACL 400 MCG/100ML IV SOLN
0.4000 ug/kg/h | INTRAVENOUS | Status: DC
  Administered 2022-01-14: 1 ug/kg/h via INTRAVENOUS
  Administered 2022-01-14: 0.5 ug/kg/h via INTRAVENOUS
  Administered 2022-01-14: 0.4 ug/kg/h via INTRAVENOUS
  Administered 2022-01-15: 0.8 ug/kg/h via INTRAVENOUS
  Administered 2022-01-15: 0.5 ug/kg/h via INTRAVENOUS
  Administered 2022-01-15 (×2): 0.8 ug/kg/h via INTRAVENOUS
  Filled 2022-01-14 (×2): qty 100
  Filled 2022-01-14: qty 200
  Filled 2022-01-14 (×3): qty 100
  Filled 2022-01-14: qty 200
  Filled 2022-01-14: qty 100

## 2022-01-14 MED ORDER — BUSPIRONE HCL 10 MG PO TABS
10.0000 mg | ORAL_TABLET | Freq: Three times a day (TID) | ORAL | Status: DC
  Administered 2022-01-14 – 2022-01-20 (×18): 10 mg
  Filled 2022-01-14 (×18): qty 1

## 2022-01-14 MED ORDER — HEPARIN SODIUM (PORCINE) 5000 UNIT/ML IJ SOLN
5000.0000 [IU] | Freq: Three times a day (TID) | INTRAMUSCULAR | Status: DC
  Administered 2022-01-14 – 2022-01-25 (×33): 5000 [IU] via SUBCUTANEOUS
  Filled 2022-01-14 (×32): qty 1

## 2022-01-14 MED ORDER — VECURONIUM BROMIDE 10 MG IV SOLR
10.0000 mg | Freq: Once | INTRAVENOUS | Status: AC
Start: 2022-01-14 — End: 2022-01-14

## 2022-01-14 MED ORDER — STERILE WATER FOR INJECTION IJ SOLN
INTRAMUSCULAR | Status: AC
  Filled 2022-01-14: qty 10

## 2022-01-14 MED ORDER — VECURONIUM BROMIDE 10 MG IV SOLR
INTRAVENOUS | Status: AC
  Administered 2022-01-14: 10 mg via INTRAVENOUS
  Filled 2022-01-14: qty 10

## 2022-01-14 MED ORDER — POTASSIUM CHLORIDE 20 MEQ PO PACK
40.0000 meq | PACK | Freq: Once | ORAL | Status: AC
  Administered 2022-01-14: 40 meq
  Filled 2022-01-14: qty 2

## 2022-01-14 NOTE — Progress Notes (Signed)
EEG maint complete.  ?

## 2022-01-14 NOTE — Progress Notes (Signed)
NAME:  Nicholas Carson, MRN:  [id number], DOB:  Mar 22, 1981, LOS: 2 ADMISSION DATE:  01/11/2022 CONSULTATION DATE:  01/26/2022 REFERRING MD:  [facility] - EDP CHIEF COMPLAINT:  Seizures  History of Present Illness:  41 year old man who presented to [facility] ED 6/8 via EMS for seizures. PMHx significant for T2DM (uncontrolled), hypertriglyceridemia, schizophrenia, polysubstance abuse (EtOH, THC, tobacco abuse).  Patient had been drinking with family (two 40oz beers) and became unresponsive. EMS called. On arrival witnessed seizure x 1 minute, IM Versed administered. Tongue laceration sustained during seizure. Patient had witnessed cardiac arrest in ED, received 20 minutes CPR, Epi x 2 with ROSC. Intubated and femoral CVC placed. Labs notable for WBC 22.9, Hgb 15.1 (baseline), Na 119, K 4.2, Bicarb < 7, glucose 232, Cr 1.30 (baseline 0.9), mild transaminitis AST 109, ALT 46. Trop 103. Ethanol level 10, UDS +THC and benzos (administered by EMS). CXR unremarkable, CT Head/Maxillofacial without acute abnormalities; CTA Head/Neck negative. Neurology consulted for seizures; AEDs/EEG ordered. ENT consulted for tongue laceration.  PCCM consulted for ICU admission.  Pertinent Medical History:   Past Medical History:  Diagnosis Date   Diabetes mellitus without complication ([place])    Hypertriglyceridemia 01/25/2022   Paranoid schizophrenia ([place]) 05/26/2017   Tobacco abuse 01/30/2022   Significant Hospital Events: Including procedures, antibiotic start and stop dates in addition to other pertinent events   6/8 - Presented to [facility] ED via EMS for seizures, tongue laceration. Coded in ED with CPR x 20 mins and Epi x 2 with subsequent ROSC. CT Head/Maxillofacial negative for acute abnormalities. CTA Head/Neck negative. Neuro consulted. ENT consulted for tongue lac. 6/9 no seizures on eeg, tongue lac repaired by plastics, some vent dyssynchrony   Interim History / Subjective:  Fluids stopped and given lasix for  hypervolemia Femoral CVL will flush but only able to draw blood back on one port s/p line tpa CTH overnight  Objective:  Blood pressure 131/84, pulse (!) 135, temperature 99 F (37.2 C), resp. rate (!) 21, height 6\' 1"  (1.854 m), weight 109.9 kg, SpO2 100 %.    Vent Mode: PRVC FiO2 (%):  [40 %] 40 % Set Rate:  [22 bmp-28 bmp] 22 bmp Vt Set:  [640 mL] 640 mL PEEP:  [5 cmH20] 5 cmH20 Plateau Pressure:  [14 cmH20-23 cmH20] 14 cmH20   Intake/Output Summary (Last 24 hours) at 01/14/2022 0830 Last data filed at 01/14/2022 0700 Gross per 24 hour  Intake 2778.33 ml  Output 3790 ml  Net -1011.67 ml   Filed Weights   01/11/2022 2323 01/13/22 0409 01/14/22 0500  Weight: 111 kg 111 kg 109.9 kg   Physical Examination: Propofol 50, fent 100 General:  critically ill adult male sedated/ intubated on MV HEENT: upward gaze, R corneal more brisk than left, pupils sluggish 3, injected conjunctiva, some brief rhythmic eyelid twitching with stimulation, cortrak left nare- new large blood clot noted in nare> no active bleeding noted, clenched teeth- oral airway/ bite block in place Neuro: does not open eyes or follow commands, flexion vs flicker to noxious stimuli CV:  ST, no murmur PULM:  non labored, occasionally breathing over vent, coarse throughout GI: soft, bs active, NT, foley- cyu Extremities: warm/dry, no LE edema  Skin: no rashes   Labs>  ABG this am > 7.364/ 39.6/ 97/ 22.6 Na 119 on admit> 128> 134 K 3.8 Bicarb 24 sCr 1.13> 0.85 WBC 22.9> 18.7 CK 7232> 9719  UOP 3.7L/ 24hrs Remains Net -2.1L  CTH 6/10 >  No evidence of global  anoxic injury or brain swelling.  Poor gray-white differentiation at the lower right insula, equivocal for infarct.  Resolved Hospital Problem List:    Assessment & Plan:  Status epilepticus, likely associated with EtOH abuse/hyponatremia Encephalopathy> multifactorial> toxic/metabolic, postictal, r/o anoxic injury following cardiac arrest  Presented  via EMS for unresponsiveness, then witnessed full body seizures. Tongue lac occurred on 2nd seizure.  Cardiac arrest after 3rd seizure. CT Head/Maxillofacial negative. CTA Head/Neck WNL. - Neuro following, appreciate recommendations - likely will need MRI brain 6/11 or 6/12 - CTH as above - cEEG ongoing> no seizures noted thus far. Profound diffuse encephalopathy.  Lots of artifact on EEG this am> ?shivering vs seizure.  Given paralytic while Neuro in room> no seizure activity   - AEDs per neuro> keppra - ativan prn seizure - will start to wean sedation today while on cEEG.  Stopping propofol, cont fentanyl - Maintain neuro protective measures; goal for eurothermia, euglycemia, eunatermia, normoxia, and PCO2 goal of 35-40 - Nutrition and bowel regiment via cortrak / TF - Seizure precautions  - Normothermia protocol as below - defer neuro prognostication till at least 72hr    Witnessed cardiac arrest with ROSC Hypertension Cardiac arrest in ED after seizure, witnessed by EDP. CPR x 20 minutes. Per Cards, rhythm NOT Afib, sinus tach with PACs - troponin hs 103> 635> 217 - remains hemodynamically stable, goal MAP > 65 - norvasc added 6/9, increase to 10mg  daily given higher SBPs - continue normothermia protocol  - follow cultures> ngtd, cont unasyn.  Leukocytosis improving, remains afebrile - does not look volume overloaded on exam, no further diuresis today    Acute hypoxemic respiratory failure in the setting of seizure, cardiac arrest Possible aspiration pneumonia Difficult airway on intubation - cont MV support, 8cc/kg IBW with goal Pplat <30 and DP<15  - VAP prevention protocol/ PPI - PAD protocol for sedation> fentanyl gtt for RASS goal 0/-1 and vent synchrony/ prevent further tongue biting/ teeth clenching.  Scheduled bowel regimen.    - wean FiO2 as able for SpO2 >92%  - daily SAT & SBT, can start weaning.  Not a candidate for extubation given mental status  - cont unasyn for  possible aspiration coverage - CXR in am   Tongue laceration with acute blood loss - s/p lac repair by plastics on 6/9 with vicryl sutures - avoid any further tongue bitting/ pressure to reduce swelling.  Continuing bite block for now as he continues to clench teeth at times - Unasyn for antibiotic coverage - peridex mouth wash  - Hgb stable, no further bleeding today noted from oral    AKI- resolved  Hyponatremia- resolving  Rhabdomyolysis  - sCr now wnl and great UOP.  S/p lasix 20mg  overnight.   - CK rising, unclear as to why> normal K, AKI resolved, no seizures thus far, no worsening metabolic syndrome concerning for propofol infusion syndrome.  Will recheck in am  - continue FWF.  MIVF stopped overnight.  Overall net -.  Avoid further diuresis.   - Na corrected, stop q 4 checks  - Trend BMP / urinary output - continue foley for now - KCL 77meq x 1 - Replace electrolytes as indicated - Avoid nephrotoxic agents, ensure adequate renal perfusion  EtOH abuse with possible withdrawal - Monitor for signs/symptoms of withdrawal.  Mother states was drinking ETOH the day of seizures.  - cont MVI/ thiamine/ folate   Tobacco abuse THC abuse - Encourage cessation when able to participate in care  Hypertriglyceridemia Home meds: Tricor - Hold Tricor at present    T2DM, poorly controlled A1C 6.5%. Home meds: Janumet-XR 50-1000. - SSI sensitive  - CBGs Q4H, goal < 180    History of schizophrenia No home medications in chart.   Best Practice: (right click and "Reselect all SmartList Selections" daily)   Diet/type: tubefeeds and NPO w/ meds via tube- cortrak DVT prophylaxis: SCDs, restart SQ heparin  GI prophylaxis: PPI Lines: Central line Foley:  Yes, and it is still needed Code Status:  full code Last date of multidisciplinary goals of care discussion [Pending]  Mother, [name] pending update 6/10.    Labs:  CBC: Recent Labs  Lab 01/16/2022 1928 01/06/2022 1945  01/13/22 0010 01/13/22 0354 01/13/22 1811 01/14/22 0431 01/14/22 0455  WBC 22.9*  --   --  22.9*  --  18.7*  --   NEUTROABS 17.1*  --   --   --   --   --   --   HGB 15.1   < > 16.0 16.4 16.3 15.6 15.3  HCT 47.0   < > 47.0 48.2 48.0 45.0 45.0  MCV 92.9  --   --  85.2  --  85.4  --   PLT 223  --   --  208  --  181  --    < > = values in this interval not displayed.   Basic Metabolic Panel: Recent Labs  Lab 01/08/2022 1928 01/13/2022 1945 01/13/22 0354 01/13/22 0726 01/13/22 1143 01/13/22 1327 01/13/22 1811 01/13/22 1836 01/14/22 0431 01/14/22 0455  NA 119*   < > 128*   < > 131*  --  132* 133* 134* 131*  K 4.2   < > 4.5  --   --   --  3.7 3.9 3.8 3.6  CL 85*  --  97*  --   --   --   --  98 100  --   CO2 <7*  --  21*  --   --   --   --  20* 24  --   GLUCOSE 232*  --  162*  --   --   --   --  150* 210*  --   BUN 10  --  12  --   --   --   --  11 7  --   CREATININE 1.30*  --  1.13  --   --   --   --  0.93 0.85  --   CALCIUM 7.5*  --  8.4*  --   --   --   --  8.9 9.1  --   MG 2.2  --  2.5*  --   --  2.6*  --  2.5* 2.1  --   PHOS  --   --  3.1  --   --  2.8  --  3.1 2.8  --    < > = values in this interval not displayed.   GFR: Estimated Creatinine Clearance: 147.2 mL/min (by C-G formula based on SCr of 0.85 mg/dL). Recent Labs  Lab 01/24/2022 1928 01/13/22 0354 01/14/22 0431  WBC 22.9* 22.9* 18.7*   Liver Function Tests: Recent Labs  Lab 02/03/2022 1928 01/13/22 0354 01/14/22 0431  AST 109* 148* 108*  ALT 46* 57* 52*  ALKPHOS 61 52 53  BILITOT 0.8 1.1 0.4  PROT 5.8* 6.8 6.4*  ALBUMIN 3.0* 3.6 3.1*   No results for input(s): "LIPASE", "AMYLASE" in the last 168 hours. No  results for input(s): "AMMONIA" in the last 168 hours.  ABG:    Component Value Date/Time   PHART 7.364 01/14/2022 0455   PCO2ART 39.6 01/14/2022 0455   PO2ART 97 01/14/2022 0455   HCO3 22.6 01/14/2022 0455   TCO2 24 01/14/2022 0455   ACIDBASEDEF 3.0 (H) 01/14/2022 0455   O2SAT 97 01/14/2022  0455    Coagulation Profile: Recent Labs  Lab 01/27/2022 2334  INR 1.1    Cardiac Enzymes: Recent Labs  Lab 01/06/2022 2135 01/14/22 0431  CKTOTAL 7,232* 9,719*    HbA1C: Hgb A1c MFr Bld  Date/Time Value Ref Range Status  01/21/2022 09:35 PM 6.5 (H) 4.8 - 5.6 % Final    Comment:    (NOTE) Pre diabetes:          5.7%-6.4%  Diabetes:              >6.4%  Glycemic control for   <7.0% adults with diabetes   08/11/2021 11:05 AM 6.0 (H) 4.8 - 5.6 % Final    Comment:    (NOTE) Pre diabetes:          5.7%-6.4%  Diabetes:              >6.4%  Glycemic control for   <7.0% adults with diabetes    CBG: Recent Labs  Lab 01/13/22 1630 01/13/22 2004 01/13/22 2322 01/14/22 0421 01/14/22 0753  GLUCAP 189* 148* 179* 178* 162*     Critical care time: 35 minutes      Kennieth Rad, ACNP [facility] 01/14/2022, 8:30 AM  See Amion for pager If no response to pager, please call PCCM consult pager After 7:00 pm call Elink

## 2022-01-14 NOTE — Progress Notes (Signed)
LTM EEG discontinued - no skin breakdown at unhook.   

## 2022-01-14 NOTE — Procedures (Signed)
Patient Name: Nicholas Carson  MRN: [id number]  Epilepsy Attending: Charlsie Quest  Referring Physician/Provider: Gordy Councilman, MD Duration: 01/13/2022 2228 to 01/14/2022 1315   Patient history: 41 year old male with presented with epilepticus.  EEG to evaluate for seizure.   Level of alertness: comatose   AEDs during EEG study: Propofol, LEV   Technical aspects: This EEG study was done with scalp electrodes positioned according to the 10-20 International system of electrode placement. Electrical activity was acquired at a sampling rate of 500Hz  and reviewed with a high frequency filter of 70Hz  and a low frequency filter of 1Hz . EEG data were recorded continuously and digitally stored.    Description: EEG showed continuous generalized background suppression.  EEG was reactive to tactile stimulation. Hyperventilation and photic stimulation were not performed.       ABNORMALITY -Background suppression, generalized   IMPRESSION: This study is suggestive of profound diffuse encephalopathy, nonspecific etiology.  No seizures or epileptiform discharges were seen throughout the recording.   [name] 

## 2022-01-14 NOTE — Progress Notes (Signed)
Subjective: No clear clincal seizure  Exam: Vitals:   01/14/22 0700 01/14/22 0751  BP: 131/84   Pulse:    Resp:    Temp: 99 F (37.2 C) 99 F (37.2 C)  SpO2: 100%    Gen: In bed, NAD Resp: non-labored breathing, no acute distress Abd: soft, nt  Neuro: MS: Does not open eyes or follow commands CN: Pupils equal round and reactive, corneals are intact, eyes are slightly dysconjugate, little movement with dolls Motor: Minimal flexion versus withdrawal in all four extremities Sensory: As above  Pertinent Labs: BMP is unremarkable  Impression: 41 year old male who suffered cardiac arrest after seizure due to aspiration of severed tongue tissue. He has remained encephalopathic.  Given the suppression on EEG, I do have some concerns about his neurological status, but do not think I have enough evidence to comment definitively at this time. Would favor MRI sometime between day 3 and 5 if he continues to not make progress neurologically.   Recommendations: 1) continue Keppra 500 mg twice daily  2) would continue EEG for another 24 hours, if still no sz, can d/c at that time.  3) Would try to minimize sedation.    Roland Rack, MD [facility] [phone]  If 7pm- 7am, please page neurology on call as listed in [place]. 01/14/2022  8:09 AM

## 2022-01-15 ENCOUNTER — Inpatient Hospital Stay (HOSPITAL_COMMUNITY): Payer: Medicare Other

## 2022-01-15 DIAGNOSIS — S01512A Laceration without foreign body of oral cavity, initial encounter: Secondary | ICD-10-CM

## 2022-01-15 DIAGNOSIS — G40901 Epilepsy, unspecified, not intractable, with status epilepticus: Secondary | ICD-10-CM | POA: Diagnosis not present

## 2022-01-15 DIAGNOSIS — J9601 Acute respiratory failure with hypoxia: Secondary | ICD-10-CM | POA: Diagnosis not present

## 2022-01-15 LAB — HEMOGLOBIN AND HEMATOCRIT, BLOOD
HCT: 33 % — ABNORMAL LOW (ref 39.0–52.0)
Hemoglobin: 11.3 g/dL — ABNORMAL LOW (ref 13.0–17.0)

## 2022-01-15 LAB — CBC
HCT: 36.1 % — ABNORMAL LOW (ref 39.0–52.0)
Hemoglobin: 11.8 g/dL — ABNORMAL LOW (ref 13.0–17.0)
MCH: 28.7 pg (ref 26.0–34.0)
MCHC: 32.7 g/dL (ref 30.0–36.0)
MCV: 87.8 fL (ref 80.0–100.0)
Platelets: 156 10*3/uL (ref 150–400)
RBC: 4.11 MIL/uL — ABNORMAL LOW (ref 4.22–5.81)
RDW: 13.6 % (ref 11.5–15.5)
WBC: 11.9 10*3/uL — ABNORMAL HIGH (ref 4.0–10.5)
nRBC: 0 % (ref 0.0–0.2)

## 2022-01-15 LAB — BASIC METABOLIC PANEL
Anion gap: 10 (ref 5–15)
BUN: 11 mg/dL (ref 6–20)
CO2: 25 mmol/L (ref 22–32)
Calcium: 9 mg/dL (ref 8.9–10.3)
Chloride: 101 mmol/L (ref 98–111)
Creatinine, Ser: 0.76 mg/dL (ref 0.61–1.24)
GFR, Estimated: >60 mL/min (ref >60)
Glucose, Bld: 228 mg/dL — ABNORMAL HIGH (ref 70–99)
Potassium: 4.3 mmol/L (ref 3.5–5.1)
Sodium: 136 mmol/L (ref 135–145)

## 2022-01-15 LAB — GLUCOSE, CAPILLARY
Glucose-Capillary: 114 mg/dL — ABNORMAL HIGH (ref 70–99)
Glucose-Capillary: 160 mg/dL — ABNORMAL HIGH (ref 70–99)
Glucose-Capillary: 174 mg/dL — ABNORMAL HIGH (ref 70–99)
Glucose-Capillary: 190 mg/dL — ABNORMAL HIGH (ref 70–99)
Glucose-Capillary: 217 mg/dL — ABNORMAL HIGH (ref 70–99)

## 2022-01-15 LAB — CK: Total CK: 5422 U/L — ABNORMAL HIGH (ref 49–397)

## 2022-01-15 LAB — PHOSPHORUS: Phosphorus: 2.6 mg/dL (ref 2.5–4.6)

## 2022-01-15 LAB — MAGNESIUM: Magnesium: 1.9 mg/dL (ref 1.7–2.4)

## 2022-01-15 LAB — TRIGLYCERIDES: Triglycerides: 230 mg/dL — ABNORMAL HIGH (ref <150)

## 2022-01-15 MED ORDER — INSULIN DETEMIR 100 UNIT/ML ~~LOC~~ SOLN
5.0000 [IU] | Freq: Two times a day (BID) | SUBCUTANEOUS | Status: DC
Start: 2022-01-15 — End: 2022-01-25
  Administered 2022-01-15 – 2022-01-25 (×20): 5 [IU] via SUBCUTANEOUS
  Filled 2022-01-15 (×22): qty 0.05

## 2022-01-15 MED ORDER — MAGNESIUM SULFATE 2 GM/50ML IV SOLN
2.0000 g | Freq: Once | INTRAVENOUS | Status: AC
  Administered 2022-01-15: 2 g via INTRAVENOUS
  Filled 2022-01-15: qty 50

## 2022-01-15 MED ORDER — GADOBUTROL 1 MMOL/ML IV SOLN
10.0000 mL | Freq: Once | INTRAVENOUS | Status: AC | PRN
Start: 2022-01-15 — End: 2022-01-15
  Administered 2022-01-15: 10 mL via INTRAVENOUS

## 2022-01-15 MED ORDER — POLYVINYL ALCOHOL 1.4 % OP SOLN
1.0000 [drp] | OPHTHALMIC | Status: DC | PRN
Start: 2022-01-15 — End: 2022-01-25
  Administered 2022-01-18 (×5): 1 [drp] via OPHTHALMIC
  Filled 2022-01-15: qty 15

## 2022-01-15 MED ORDER — SENNOSIDES-DOCUSATE SODIUM 8.6-50 MG PO TABS
1.0000 | ORAL_TABLET | Freq: Two times a day (BID) | ORAL | Status: DC
  Administered 2022-01-15 – 2022-01-20 (×11): 1
  Filled 2022-01-15 (×11): qty 1

## 2022-01-15 NOTE — Progress Notes (Signed)
[facility] ADULT ICU REPLACEMENT PROTOCOL   The patient does apply for the [facility] Adult ICU Electrolyte Replacment Protocol based on the criteria listed below:   1.Exclusion criteria: TCTS patients, ECMO patients, and Dialysis patients 2. Is GFR >/= 30 ml/min? Yes.    Patient's GFR today is >60 3. Is SCr </= 2? Yes.   Patient's SCr is 0.76 mg/dL 4. Did SCr increase >/= 0.5 in 24 hours? No. 5.Pt's weight >40kg  Yes.   6. Abnormal electrolyte(s): Mag  7. Electrolytes replaced per protocol 8.  Call MD STAT for K+ </= 2.5, Phos </= 1, or Mag </= 1 Physician:  Alwyn Pea Amil Moseman 01/15/2022 5:08 AM

## 2022-01-15 NOTE — Progress Notes (Signed)
Subjective: No clear clincal seizure  Exam: Current vital signs: BP 95/65 (BP Location: Right Arm)   Pulse 86   Temp 99 F (37.2 C)   Resp (!) 22   Ht 6\' 1"  (1.854 m)   Wt (S) 115.3 kg Comment: with TTM. Weight 6/10 w/o TTM  SpO2 100%   BMI 33.54 kg/m  Vital signs in last 24 hours: Temp:  [97.3 F (36.3 C)-99.7 F (37.6 C)] 99 F (37.2 C) (06/11 0700) Pulse Rate:  [65-137] 86 (06/11 0700) Resp:  [19-25] 22 (06/11 0700) BP: (93-172)/(58-100) 95/65 (06/11 0700) SpO2:  [100 %] 100 % (06/11 0700) FiO2 (%):  [40 %] 40 % (06/11 0755) Weight:  [115.3 kg] 115.3 kg (06/11 0323)   Gen: In bed, NAD, intubated Resp: non-labored breathing, at vent set rate Abd: soft, nt  Neuro: MS: Eyes open after coughing, gradually close. Does not follow commands or interact CN: Pupils equal round and reactive, corneals are intact (brisker on the right (eyelash brush) than left (gauze), eyes are slightly dysconjugate in fixed extreme upgaze, no movement with dolls Motor: No movement in all four extremities Sensory: As above Reflexes: 2+ but diminished brachioradilias, 2+ and brisk patellae  Pertinent Labs: BMP is unremarkable  Impression: 41 year old male who suffered cardiac arrest after seizure due to aspiration of severed tongue tissue. He has remained encephalopathic and EEG suppression highly concerning for significant brain injury. Exam today was after a sedation bolus for coughing; notably worse than Dr. Cecil Cobbs exam yesterday (loss of peripheral movement).   Recommendations: 1) continue Keppra 500 mg twice daily  2) MRI brain today 3) Minimize sedation.    Lesleigh Noe MD-PhD [facility] [phone]   If 7pm- 7am, please page neurology on call as listed in [place]. 01/15/2022  9:44 AM  CRITICAL CARE Performed by: Lorenza Chick   Total critical care time: 30 minutes  Critical care time was exclusive of separately billable procedures and treating other  patients.  Critical care was necessary to treat or prevent imminent or life-threatening deterioration.  Critical care was time spent personally by me on the following activities: development of treatment plan with patient and/or surrogate as well as nursing, discussions with consultants, evaluation of patient's response to treatment, examination of patient, obtaining history from patient or surrogate, ordering and performing treatments and interventions, ordering and review of laboratory studies, ordering and review of radiographic studies, pulse oximetry and re-evaluation of patient's condition.

## 2022-01-15 NOTE — Progress Notes (Signed)
  PCCM Interval Note   Mother, Clydie Braun and grandmother updated on today's event- ongoing poor clinical exam, and MRI findings c/w diffuse anoxic brain injury.  Family understandably distraught.  Code status not addressed as family obviously overwhelmed and needs time to process information.    Plan is to continue current care, minimize sedation to see if mental status improves any and readdress goal of care.  Ongoing family support provided.  Family are thankful for the care provided thus far.     Posey Boyer, ACNP [facility] 01/15/2022, 5:37 PM  See Amion for pager If no response to pager, please call PCCM consult pager After 7:00 pm call Elink

## 2022-01-15 NOTE — Progress Notes (Signed)
NAME:  Nicholas Carson, MRN:  [id number], DOB:  1980/11/11, LOS: 3 ADMISSION DATE:  01/21/2022 CONSULTATION DATE:  01/21/2022 REFERRING MD:  Kommor - EDP CHIEF COMPLAINT:  Seizures  History of Present Illness:  41 year old man who presented to [facility] ED 6/8 via EMS for seizures. PMHx significant for T2DM (uncontrolled), hypertriglyceridemia, schizophrenia, polysubstance abuse (EtOH, THC, tobacco abuse).  Patient had been drinking with family (two 40oz beers) and became unresponsive. EMS called. On arrival witnessed seizure x 1 minute, IM Versed administered. Tongue laceration sustained during seizure. Patient had witnessed cardiac arrest in ED, received 20 minutes CPR, Epi x 2 with ROSC. Intubated and femoral CVC placed. Labs notable for WBC 22.9, Hgb 15.1 (baseline), Na 119, K 4.2, Bicarb < 7, glucose 232, Cr 1.30 (baseline 0.9), mild transaminitis AST 109, ALT 46. Trop 103. Ethanol level 10, UDS +THC and benzos (administered by EMS). CXR unremarkable, CT Head/Maxillofacial without acute abnormalities; CTA Head/Neck negative. Neurology consulted for seizures; AEDs/EEG ordered. ENT consulted for tongue laceration.  Pertinent Medical History:   Past Medical History:  Diagnosis Date   Diabetes mellitus without complication ([place])    Hypertriglyceridemia 01/24/2022   Paranoid schizophrenia ([place]) 05/26/2017   Tobacco abuse 01/27/2022   Significant Hospital Events: Including procedures, antibiotic start and stop dates in addition to other pertinent events   6/8 - Presented to [facility] ED via EMS for seizures, tongue laceration. Coded in ED with CPR x 20 mins and Epi x 2 with subsequent ROSC. CT Head/Maxillofacial negative for acute abnormalities. CTA Head/Neck negative. Neuro consulted. ENT consulted for tongue lac. 6/9 no seizures on eeg, tongue lac repaired by plastics, some vent dyssynchrony  6/10 eeg no seizure, cEEG d/c'd, off propofol to precedex   Interim History / Subjective:  No events  Objective:   Blood pressure 95/65, pulse 86, temperature 99 F (37.2 C), resp. rate (!) 22, height 6\' 1"  (1.854 m), weight (S) 115.3 kg, SpO2 100 %.    Vent Mode: PRVC FiO2 (%):  [40 %] 40 % Set Rate:  [22 bmp] 22 bmp Vt Set:  [640 mL] 640 mL PEEP:  [5 cmH20] 5 cmH20 Plateau Pressure:  [13 cmH20-23 cmH20] 22 cmH20   Intake/Output Summary (Last 24 hours) at 01/15/2022 0906 Last data filed at 01/15/2022 0700 Gross per 24 hour  Intake 3250.24 ml  Output 800 ml  Net 2450.24 ml   Filed Weights   01/13/22 0409 01/14/22 0500 01/15/22 0323  Weight: 111 kg 109.9 kg (S) 115.3 kg   Physical Examination: Fentanyl 125, dex 0.8 General:  critically ill obese, adult male lying in bed in NAD HEENT: MM pink/moist, ETT with bite block- no tongue protusion, no oral bloody secretions, left nare cortrak- no active bleeding, large clot in left nare is gone, pupils are sharp upward gaze, 3/sluggish, +corneals Neuro:  opens eyes, clenches teeth, some reflexive movement/ grasping in hands and becomes dyssynchronous with stimulation, no purposeful movements, no rhythmic movements, no response in LEs CV: rr, NSR to ST  PULM:  MV supported, initially apneic on SBT this am but not weaning some, diffuse coarse and rhonchi, with moderate amount of tan secretions with scant old blood, coughs  GI: protuberant, hypoBS, foley- more amber today Extremities: warm/dry, trace generalized edema  Skin: no rashes  CBG range 173- 227 Labs>  Na 136 WBC 22.9> 18.7> 11.9 CK 7232> 9719> 5422 Hgb 15.3> 11.8  UOP 920 L/ 24hrs Remains +362 ml  CTH 6/10 >  No evidence of global anoxic injury  or brain swelling.  Poor gray-white differentiation at the lower right insula, equivocal for infarct.  Resolved Hospital Problem List:    Assessment & Plan:  Status epilepticus, likely associated with EtOH abuse/hyponatremia Encephalopathy> multifactorial> toxic/metabolic, postictal, r/o anoxic injury following cardiac arrest  Presented via  EMS for unresponsiveness, then witnessed full body seizures. Tongue lac occurred on 2nd seizure.  Cardiac arrest after 3rd seizure. CT Head/Maxillofacial negative. CTA Head/Neck WNL. - Neuro following, appreciate recommendations - MRI brain today - cEEG d/c'd 6/10> no seizure activity noted, diffuse encephalopathy - AEDs per neuro> keppra - Maintain neuro protective measures; goal for eurothermia, euglycemia, eunatermia, normoxia, and PCO2 goal of 35-40 - Nutrition and bowel regiment via cortrak / TF - Seizure precautions  - Normothermia protocol completes today  - pending neuro prognostication> if MRI does not show any abnormalities> will need LP.  Remains guarded given poor clinical exam which has not improved and movements have been non purposeful.  Suspect anoxic injury.    Witnessed cardiac arrest with ROSC Hypertension Cardiac arrest in ED after seizure, witnessed by EDP. CPR x 20 minutes. Per Cards, rhythm NOT Afib, sinus tach with PACs - remains hemodynamically stable, goal MAP > 65 - continue norvasc  - follow cultures to rule out infectious component> ngtd, cont unasyn for 5 days  - closely monitor volume status   Acute hypoxemic respiratory failure in the setting of seizure, cardiac arrest Possible aspiration pneumonia Difficult airway on intubation - cont MV support, 8cc/kg IBW with goal Pplat <30 and DP<15  - VAP prevention protocol/ PPI - PAD protocol for sedation> fentanyl/ precedex gtt for RASS goal 0/-1 and vent synchrony/ prevent further tongue biting/ teeth clenching.  Scheduled bowel regimen.    - wean FiO2 as able for SpO2 >92%  - daily SAT & SBT.  Not a candidate for extubation given mental status  - cont unasyn for possible aspiration coverage- 5 days - CXR in am   Tongue laceration with acute blood loss - s/p lac repair by plastics on 6/9 with vicryl sutures - avoid any further tongue bitting/ pressure to reduce swelling.  Continuing bite block for now as he  continues to clench teeth with stimulation  - Unasyn for antibiotic coverage - peridex mouth wash  - Hgb downtrend, expected.  No further bleeding today noted from oral/ nasal    AKI- resolved  Hyponatremia- resolving  Rhabdomyolysis - improving  - monitor UOP/ trend renal indices  - continue foley  - Replace electrolytes as indicated - Avoid nephrotoxic agents, ensure adequate renal perfusion  EtOH abuse with possible withdrawal - Monitor for signs/symptoms of withdrawal.  Mother states was drinking ETOH the day of seizures.  - cont MVI/ thiamine/ folate   Tobacco abuse THC abuse - Encourage cessation when able to participate in care   Hypertriglyceridemia Home meds: Tricor - Hold Tricor at present    T2DM, poorly controlled A1C 6.5%. Home meds: Janumet-XR 50-1000. - SSI sensitive, adding levemir given CBGs> 170s - CBGs Q4H, goal < 180    History of schizophrenia No home medications in chart.   Best Practice: (right click and "Reselect all SmartList Selections" daily)   Diet/type: tubefeeds and NPO w/ meds via tube- cortrak DVT prophylaxis: SCDs, SQ heparin  GI prophylaxis: PPI Lines: Central line Foley:  Yes, and it is still needed Code Status:  full code Last date of multidisciplinary goals of care discussion [Pending] Pending update 6/11.   Labs:  CBC: Recent Labs  Lab 01/25/2022  1928 01/11/2022 1945 01/13/22 0354 01/13/22 1811 01/14/22 0431 01/14/22 0455 01/15/22 0400  WBC 22.9*  --  22.9*  --  18.7*  --  11.9*  NEUTROABS 17.1*  --   --   --   --   --   --   HGB 15.1   < > 16.4 16.3 15.6 15.3 11.8*  HCT 47.0   < > 48.2 48.0 45.0 45.0 36.1*  MCV 92.9  --  85.2  --  85.4  --  87.8  PLT 223  --  208  --  181  --  156   < > = values in this interval not displayed.   Basic Metabolic Panel: Recent Labs  Lab 01/27/2022 1928 01/27/2022 1945 01/13/22 0354 01/13/22 0726 01/13/22 1327 01/13/22 1811 01/13/22 1836 01/14/22 0431 01/14/22 0455  01/14/22 1802 01/15/22 0400  NA 119*   < > 128*   < >  --  132* 133* 134* 131*  --  136  K 4.2   < > 4.5  --   --  3.7 3.9 3.8 3.6  --  4.3  CL 85*  --  97*  --   --   --  98 100  --   --  101  CO2 <7*  --  21*  --   --   --  20* 24  --   --  25  GLUCOSE 232*  --  162*  --   --   --  150* 210*  --   --  228*  BUN 10  --  12  --   --   --  11 7  --   --  11  CREATININE 1.30*  --  1.13  --   --   --  0.93 0.85  --   --  0.76  CALCIUM 7.5*  --  8.4*  --   --   --  8.9 9.1  --   --  9.0  MG 2.2  --  2.5*  --  2.6*  --  2.5* 2.1  --  1.8 1.9  PHOS  --    < > 3.1  --  2.8  --  3.1 2.8  --  2.9 2.6   < > = values in this interval not displayed.   GFR: Estimated Creatinine Clearance: 160.1 mL/min (by C-G formula based on SCr of 0.76 mg/dL). Recent Labs  Lab 01/08/2022 1928 01/13/22 0354 01/14/22 0431 01/15/22 0400  WBC 22.9* 22.9* 18.7* 11.9*   Liver Function Tests: Recent Labs  Lab 01/24/2022 1928 01/13/22 0354 01/14/22 0431  AST 109* 148* 108*  ALT 46* 57* 52*  ALKPHOS 61 52 53  BILITOT 0.8 1.1 0.4  PROT 5.8* 6.8 6.4*  ALBUMIN 3.0* 3.6 3.1*   No results for input(s): "LIPASE", "AMYLASE" in the last 168 hours. No results for input(s): "AMMONIA" in the last 168 hours.  ABG:    Component Value Date/Time   PHART 7.364 01/14/2022 0455   PCO2ART 39.6 01/14/2022 0455   PO2ART 97 01/14/2022 0455   HCO3 22.6 01/14/2022 0455   TCO2 24 01/14/2022 0455   ACIDBASEDEF 3.0 (H) 01/14/2022 0455   O2SAT 97 01/14/2022 0455    Coagulation Profile: Recent Labs  Lab 01/21/2022 2334  INR 1.1    Cardiac Enzymes: Recent Labs  Lab 01/24/2022 2135 01/14/22 0431  CKTOTAL 7,232* 9,719*    HbA1C: Hgb A1c MFr Bld  Date/Time Value Ref Range Status  01/31/2022 09:35 PM  6.5 (H) 4.8 - 5.6 % Final    Comment:    (NOTE) Pre diabetes:          5.7%-6.4%  Diabetes:              >6.4%  Glycemic control for   <7.0% adults with diabetes   08/11/2021 11:05 AM 6.0 (H) 4.8 - 5.6 % Final     Comment:    (NOTE) Pre diabetes:          5.7%-6.4%  Diabetes:              >6.4%  Glycemic control for   <7.0% adults with diabetes    CBG: Recent Labs  Lab 01/14/22 1212 01/14/22 1623 01/14/22 1934 01/14/22 2303 01/15/22 0807  GLUCAP 201* 173* 188* 227* 190*     Critical care time: 32 minutes      Kennieth Rad, ACNP  Pulmonary & Critical Care 01/15/2022, 9:06 AM  See Amion for pager If no response to pager, please call PCCM consult pager After 7:00 pm call Elink

## 2022-01-15 NOTE — Progress Notes (Signed)
Patient transported to MRI without complications. Vitals stable throughout and returned to 2H20. RT will continue to monitor.

## 2022-01-16 ENCOUNTER — Inpatient Hospital Stay (HOSPITAL_COMMUNITY): Payer: Medicare Other

## 2022-01-16 DIAGNOSIS — J9601 Acute respiratory failure with hypoxia: Secondary | ICD-10-CM | POA: Diagnosis not present

## 2022-01-16 DIAGNOSIS — G931 Anoxic brain damage, not elsewhere classified: Secondary | ICD-10-CM

## 2022-01-16 DIAGNOSIS — Z7189 Other specified counseling: Secondary | ICD-10-CM | POA: Diagnosis not present

## 2022-01-16 DIAGNOSIS — S01512A Laceration without foreign body of oral cavity, initial encounter: Secondary | ICD-10-CM | POA: Diagnosis not present

## 2022-01-16 DIAGNOSIS — G40901 Epilepsy, unspecified, not intractable, with status epilepticus: Secondary | ICD-10-CM | POA: Diagnosis not present

## 2022-01-16 LAB — CBC
HCT: 33.8 % — ABNORMAL LOW (ref 39.0–52.0)
Hemoglobin: 11.1 g/dL — ABNORMAL LOW (ref 13.0–17.0)
MCH: 29.2 pg (ref 26.0–34.0)
MCHC: 32.8 g/dL (ref 30.0–36.0)
MCV: 88.9 fL (ref 80.0–100.0)
Platelets: 160 10*3/uL (ref 150–400)
RBC: 3.8 MIL/uL — ABNORMAL LOW (ref 4.22–5.81)
RDW: 13.8 % (ref 11.5–15.5)
WBC: 11.2 10*3/uL — ABNORMAL HIGH (ref 4.0–10.5)
nRBC: 0 % (ref 0.0–0.2)

## 2022-01-16 LAB — BASIC METABOLIC PANEL
Anion gap: 5 (ref 5–15)
BUN: 11 mg/dL (ref 6–20)
CO2: 27 mmol/L (ref 22–32)
Calcium: 8.4 mg/dL — ABNORMAL LOW (ref 8.9–10.3)
Chloride: 103 mmol/L (ref 98–111)
Creatinine, Ser: 0.8 mg/dL (ref 0.61–1.24)
GFR, Estimated: >60 mL/min (ref >60)
Glucose, Bld: 165 mg/dL — ABNORMAL HIGH (ref 70–99)
Potassium: 4.4 mmol/L (ref 3.5–5.1)
Sodium: 135 mmol/L (ref 135–145)

## 2022-01-16 LAB — GLUCOSE, CAPILLARY
Glucose-Capillary: 186 mg/dL — ABNORMAL HIGH (ref 70–99)
Glucose-Capillary: 182 mg/dL — ABNORMAL HIGH (ref 70–99)
Glucose-Capillary: 193 mg/dL — ABNORMAL HIGH (ref 70–99)
Glucose-Capillary: 174 mg/dL — ABNORMAL HIGH (ref 70–99)
Glucose-Capillary: 203 mg/dL — ABNORMAL HIGH (ref 70–99)

## 2022-01-16 LAB — PHOSPHORUS: Phosphorus: 4.1 mg/dL (ref 2.5–4.6)

## 2022-01-16 LAB — MAGNESIUM: Magnesium: 1.9 mg/dL (ref 1.7–2.4)

## 2022-01-16 MED ORDER — ACETAMINOPHEN 160 MG/5ML PO SOLN
650.0000 mg | Freq: Four times a day (QID) | ORAL | Status: DC | PRN
  Filled 2022-01-16: qty 20.3

## 2022-01-16 MED ORDER — ONDANSETRON HCL 4 MG/2ML IJ SOLN: 4.0000 mg | Freq: Four times a day (QID) | INTRAMUSCULAR | Status: DC | PRN

## 2022-01-16 MED ORDER — ACETAMINOPHEN 160 MG/5ML PO SOLN
650.0000 mg | Freq: Four times a day (QID) | ORAL | Status: DC | PRN
Start: 2022-01-16 — End: 2022-01-17
  Administered 2022-01-16 – 2022-01-17 (×5): 650 mg
  Filled 2022-01-16 (×4): qty 20.3

## 2022-01-16 MED ORDER — MAGNESIUM SULFATE 2 GM/50ML IV SOLN
2.0000 g | Freq: Once | INTRAVENOUS | Status: AC
Start: 2022-01-16 — End: 2022-01-16
  Administered 2022-01-16: 2 g via INTRAVENOUS
  Filled 2022-01-16: qty 50

## 2022-01-16 MED ORDER — SCOPOLAMINE 1 MG/3DAYS TD PT72
1.0000 | MEDICATED_PATCH | TRANSDERMAL | Status: DC
  Administered 2022-01-16 – 2022-01-25 (×4): 1.5 mg via TRANSDERMAL
  Filled 2022-01-16 (×5): qty 1

## 2022-01-16 MED FILL — Sodium Chloride IV Soln 0.9%: INTRAVENOUS | Qty: 250 | Status: AC

## 2022-01-16 MED FILL — Fentanyl Citrate Preservative Free (PF) Inj 2500 MCG/50ML: INTRAMUSCULAR | Qty: 50 | Status: AC

## 2022-01-16 NOTE — Progress Notes (Addendum)
Neurology Progress Note Subjective: Seen in room, no family at the bedside. Remains intubated, sedation paused prior to exam by RN.   Exam: Vitals:   01/16/22 0745 01/16/22 0800  BP: 120/68 115/63  Pulse: 100 88  Resp: (!) 22 (!) 22  Temp:    SpO2: 100% 100%   Gen: In bed, intubated Resp: ventilated Abd: soft, nt  Neuro: MS: Does not follow commands or track examiner CN: PERRL, eyes deviated upward, fixed, corneals intact.  Motor: No movement in all four extremities Sensory: As above Reflexes: 2+ but diminished brachioradilias, 2+ and brisk patellae  Pertinent Labs: BMP unremarkable WBC 11.2  Imaging reviewed MRI Head- Global hypoxic-ischemic injury. No significant swelling or herniation.  Impression: 41 y.o. male presenting after seizure resulting in aspiration of severed tongue tissue leading to cardiac arrest. Sedation stopped prior to exam today. MRI shows global hypoxic ischemic injury and EEG with suppression concerning for significant brain injury.   Recommendations: - Continue Keppra 500mg  BID - Keep sedation off - Palliative care consult per CCM  Patient seen and examined by NP/APP with MD. MD to update note as needed.   Janine Ores, DNP, FNP-BC [facility] Pager: [phone]   Attending Attestation:  Patient seen, examined, labs,vitals and notes reviewed. Discussed plan with Charlean Merl, NP and agree with assessment and plan as documented above. I have independently reviewed the chart, obtained history, review of systems and examined the patient.  This patient is critically ill and at significant risk of neurological worsening, death and care requires constant monitoring of vital signs, hemodynamics,respiratory and cardiac monitoring, neurological assessment, discussion with family, other specialists and medical decision making of high complexity. I spent 30 minutes of neurocritical care time  in the care of  this patient. This was time spent  independent of any time provided by nurse practitioner or PA.  Electronically signed by:  Lynnae Sandhoff, MD Page: [phone] 01/16/2022, 6:00 PM

## 2022-01-16 NOTE — Consult Note (Signed)
Consultation Note Date: 01/16/2022   Patient Name: Nicholas Carson  DOB: Apr 22, 1981  MRN: [id number]  Age / Sex: 41 y.o., male  PCP: Bartholome Bill, MD Referring Physician: Candee Furbish, MD  Reason for Consultation: "EOL talk"   HPI/Patient Profile: 41 y.o. male  with past medical history of DM2, hypertriglyceridemia, schizophrenia with inpatient admission for psychosis in January 0973, salicylate intoxication requiring intubation in 2018, admitted on 01/27/2022 for cardiac arrest with CPR and ROSC after 20 mins- this was due to seizures in which he had a tongue laceration and aspirated and resulted in respiratory failure. EEG showed continuous generalized background suppression. MRI yesterday shows diffuse anoxic brain injury. Sedation has been stopped and patient remains unresponsive.  Palliative medicine consulted for "EOL talk help".    Primary Decision Maker NEXT OF KIN - patient's mother- Marygrace Drought  Discussion: I have reviewed medical records including Care Everywhere, progress notes from this and prior admissions, labs and imaging, discussed with RN.  On evaluation patient is intubated, off sedation, unresponsive.   I met with his mother- Marygrace Drought. Dr. Tamala Julian was also present and gave medical update and support.   I introduced Palliative Medicine as specialized medical care for people living with serious illness. It focuses on providing relief from the symptoms and stress of a serious illness. The goal is to improve quality of life for both the patient and the family.  We discussed a brief life review of the patient. He is known to be very kind and loving. He graduated with honors from [employer]. He had his first break at age 78. He has struggled with his schizophrenia and paranoia, sometimes not taking his medications appropriately. He has had a tough life.   We discussed patient's  current illness and what it means in the larger context of patient's on-going co-morbidities.  Natural disease trajectory and expectations at EOL were discussed.  Code status was discussed, [name] stated she could not answer at this moment.   Questions were answered regarding the relationship of Iven's schizophrenia to his current brain injury- discussed the difference between anoxic brain injury and schizophrenia.   [name] understands the gravity of Chaos's situation. She is understandably emotional- Kasai is her oldest child. She notes that Deleon would not want a life of being kept alive by machine. She states that is not a good quality of life. She expresses that she is processing all of the information and also discussing with other family members including Devario's siblings and her own mother.   [name] would like some time to see if Terri makes any improvements before making any final decisions. She requests to wait until Friday and then to bring family together for more conversation.    Hard choices book given along with my contact information.    SUMMARY OF RECOMMENDATIONS -Continue current care -PMT will make daily visits and follow patient's progress -[name] requesting to wait until Friday for further meeting with other family members    Code Status/Advance Care Planning: Full  code   Prognosis:   Unable to determine  Discharge Planning: To Be Determined  Primary Diagnoses: Present on Admission:  Acute respiratory failure with hypoxia ([place])  Tongue laceration   Review of Systems  Unable to perform ROS: Intubated    Physical Exam Vitals and nursing note reviewed.  Cardiovascular:     Comments: Diffuse anasarca Neurological:     Comments: Gaze fixed upward, no response to stimulation     Vital Signs: BP 130/71   Pulse 81   Temp 99.3 F (37.4 C) (Axillary)   Resp (!) 22   Ht $R'6\' 1"'vw$  (1.854 m)   Wt 111.2 kg   SpO2 100%   BMI 32.34 kg/m  Pain Scale: CPOT        SpO2: SpO2: 100 % O2 Device:SpO2: 100 % O2 Flow Rate: .   IO: Intake/output summary:  Intake/Output Summary (Last 24 hours) at 01/16/2022 1119 Last data filed at 01/16/2022 0800 Gross per 24 hour  Intake 2668.83 ml  Output 565 ml  Net 2103.83 ml    LBM: Last BM Date : 01/15/22 Baseline Weight: Weight: 90 kg Most recent weight: Weight: 111.2 kg       Thank you for this consult. Palliative medicine will continue to follow and assist as needed.  Time Total: 97 minutes Greater than 50%  of this time was spent counseling and coordinating care related to the above assessment and plan.  Signed by: Mariana Kaufman, AGNP-C Palliative Medicine    Please contact Palliative Medicine Team phone at [phone] for questions and concerns.  For individual provider: See Shea Evans

## 2022-01-16 NOTE — Progress Notes (Signed)
NAME:  Nicholas Carson, MRN:  [id number], DOB:  Dec 14, 1980, LOS: 4 ADMISSION DATE:  01/11/2022 CONSULTATION DATE:  01/19/2022 REFERRING MD:  Kommor - EDP CHIEF COMPLAINT:  Seizures  History of Present Illness:  41 year old man who presented to [facility] ED 6/8 via EMS for seizures. PMHx significant for T2DM (uncontrolled), hypertriglyceridemia, schizophrenia, polysubstance abuse (EtOH, THC, tobacco abuse).  Patient had been drinking with family (two 40oz beers) and became unresponsive. EMS called. On arrival witnessed seizure x 1 minute, IM Versed administered. Tongue laceration sustained during seizure. Patient had witnessed cardiac arrest in ED, received 20 minutes CPR, Epi x 2 with ROSC. Intubated and femoral CVC placed. Labs notable for WBC 22.9, Hgb 15.1 (baseline), Na 119, K 4.2, Bicarb < 7, glucose 232, Cr 1.30 (baseline 0.9), mild transaminitis AST 109, ALT 46. Trop 103. Ethanol level 10, UDS +THC and benzos (administered by EMS). CXR unremarkable, CT Head/Maxillofacial without acute abnormalities; CTA Head/Neck negative. Neurology consulted for seizures; AEDs/EEG ordered. ENT consulted for tongue laceration.  PCCM consulted for ICU admission.  Pertinent Medical History:   Past Medical History:  Diagnosis Date   Diabetes mellitus without complication ([place])    Hypertriglyceridemia 01/10/2022   Paranoid schizophrenia ([place]) 05/26/2017   Tobacco abuse 02/01/2022   Significant Hospital Events: Including procedures, antibiotic start and stop dates in addition to other pertinent events   6/8 - Presented to [facility] ED via EMS for seizures, tongue laceration. Coded in ED with CPR x 20 mins and Epi x 2 with subsequent ROSC. CT Head/Maxillofacial negative for acute abnormalities. CTA Head/Neck negative. Neuro consulted. ENT consulted for tongue lac. 6/9 no seizures on eeg, tongue lac repaired by plastics, some vent dyssynchrony   Interim History / Subjective:  Remains poorly responsive on vent. Still on  fent/precedex for ?vent compliance  Objective:  Blood pressure 116/70, pulse 96, temperature 99.3 F (37.4 C), temperature source Axillary, resp. rate (!) 22, height 6\' 1"  (1.854 m), weight 111.2 kg, SpO2 100 %.    Vent Mode: PRVC FiO2 (%):  [40 %-50 %] 50 % Set Rate:  [22 bmp] 22 bmp Vt Set:  [640 mL] 640 mL PEEP:  [5 cmH20] 5 cmH20 Pressure Support:  [10 cmH20] 10 cmH20 Plateau Pressure:  [13 cmH20-22 cmH20] 13 cmH20   Intake/Output Summary (Last 24 hours) at 01/16/2022 0742 Last data filed at 01/16/2022 0700 Gross per 24 hour  Intake 2957.7 ml  Output 670 ml  Net 2287.7 ml    Filed Weights   01/14/22 0500 01/15/22 0323 01/16/22 0405  Weight: 109.9 kg (S) 115.3 kg 111.2 kg   Physical Examination: Intubated, near comatose Eyes are rolled upwards, cannot assess pupils +cough, + gag, triggers vent Lungs with crackles bases Ext warm No motor response to pain for me  Labs look ok  Resolved Hospital Problem List:    Assessment & Plan:  Status epilepticus, likely associated with EtOH abuse/hyponatremia Anoxic brain injury- exam and MRI are not encouraging for a meaningful recovery Prolonged cardiac arrest from seizures Probable aspiration pneumonia, hypoxemic respiratory failure Secondary rhabdomyolysis improved Tobacco/THC/alcohol abuse hx DM2- CBGs okay Hx schizophrenia Tongue injury/laceration  - Lung protective tidal volumes limiting driving pressures to < 15cm H2O as able - Sedation titrated to vent compliance and patient comfort using PAD orderset - VAP prevention bundle - Wean sedation to off today, will adjust vent PRN - Unasyn x 5 days - Palliative care consult - Neurology following, appreciate input  Best Practice: (right click and "Reselect all SmartList Selections"  daily)   Diet/type: tubefeeds and NPO w/ meds via tube- cortrak DVT prophylaxis: SQ heparin  GI prophylaxis: PPI Lines: Central line Foley:  Yes, and it is still needed Code Status:   full code Last date of multidisciplinary goals of care discussion [ongoing]  35 min cc time Erskine Emery MD PCCM

## 2022-01-17 DIAGNOSIS — G931 Anoxic brain damage, not elsewhere classified: Secondary | ICD-10-CM | POA: Diagnosis not present

## 2022-01-17 DIAGNOSIS — Z7189 Other specified counseling: Secondary | ICD-10-CM | POA: Diagnosis not present

## 2022-01-17 DIAGNOSIS — G40901 Epilepsy, unspecified, not intractable, with status epilepticus: Secondary | ICD-10-CM | POA: Diagnosis not present

## 2022-01-17 DIAGNOSIS — J9601 Acute respiratory failure with hypoxia: Secondary | ICD-10-CM | POA: Diagnosis not present

## 2022-01-17 LAB — GLUCOSE, CAPILLARY
Glucose-Capillary: 184 mg/dL — ABNORMAL HIGH (ref 70–99)
Glucose-Capillary: 150 mg/dL — ABNORMAL HIGH (ref 70–99)
Glucose-Capillary: 173 mg/dL — ABNORMAL HIGH (ref 70–99)
Glucose-Capillary: 191 mg/dL — ABNORMAL HIGH (ref 70–99)
Glucose-Capillary: 200 mg/dL — ABNORMAL HIGH (ref 70–99)

## 2022-01-17 LAB — BASIC METABOLIC PANEL WITH GFR
Anion gap: 11 (ref 5–15)
BUN: 12 mg/dL (ref 6–20)
CO2: 27 mmol/L (ref 22–32)
Calcium: 8.9 mg/dL (ref 8.9–10.3)
Chloride: 99 mmol/L (ref 98–111)
Creatinine, Ser: 0.69 mg/dL (ref 0.61–1.24)
GFR, Estimated: >60 mL/min
Glucose, Bld: 175 mg/dL — ABNORMAL HIGH (ref 70–99)
Potassium: 3.9 mmol/L (ref 3.5–5.1)
Sodium: 137 mmol/L (ref 135–145)

## 2022-01-17 LAB — METHYLMALONIC ACID, SERUM: Methylmalonic Acid, Quantitative: 128 nmol/L (ref 0–378)

## 2022-01-17 LAB — MAGNESIUM: Magnesium: 2 mg/dL (ref 1.7–2.4)

## 2022-01-17 MED ORDER — METOPROLOL TARTRATE 5 MG/5ML IV SOLN
2.5000 mg | INTRAVENOUS | Status: DC | PRN
  Administered 2022-01-17 – 2022-01-19 (×2): 5 mg via INTRAVENOUS
  Administered 2022-01-20 (×2): 2.5 mg via INTRAVENOUS
  Filled 2022-01-17 (×3): qty 5

## 2022-01-17 MED ORDER — SODIUM CHLORIDE 0.9 % IV BOLUS
500.0000 mL | Freq: Once | INTRAVENOUS | Status: AC
  Administered 2022-01-17: 500 mL via INTRAVENOUS

## 2022-01-17 MED ORDER — BROMOCRIPTINE MESYLATE 2.5 MG PO TABS
7.5000 mg | ORAL_TABLET | Freq: Every day | ORAL | Status: DC
Start: 2022-01-17 — End: 2022-01-21
  Administered 2022-01-17 – 2022-01-20 (×4): 7.5 mg
  Filled 2022-01-17 (×5): qty 3

## 2022-01-17 NOTE — Progress Notes (Addendum)
PCCM Interval Note   Family requesting [facility] transfer for second opinion.  Call placed to [facility] transfer center and information given.  Pending return call.    Addendum 1523> [facility] declined transfer per their medical director based on capacity levels.  Spoke with Morrie Sheldon in transfer center.       Posey Boyer, ACNP [place] [facility] Critical [facility] 01/17/2022, 1:56 PM  See Amion for pager If no response to pager, please call PCCM consult pager After 7:00 pm call Elink

## 2022-01-17 NOTE — Progress Notes (Signed)
eLink Physician-Brief Progress Note Patient Name: Nicholas Carson DOB: 06-17-1981 MRN: [id number]   Date of Service  01/17/2022  HPI/Events of Note  Multiple issues: 1. Fever to 101.8 F which has not responded to Tylenol and ice packs. AST = 108 and ALT = 52. 2. Tachycardia to 159. BP = 173/91 with MAP = 113.   eICU Interventions  Plan: Cooling blanket PRN. D/C Tylenol.  Bolus with 0.9 NaCl 500 mL IV over 30 minutes now.  Metoprolol 2.5 - 5 mg IV Q 3 hours PRN HR > 115.      Intervention Category Major Interventions: Arrhythmia - evaluation and management;Hypertension - evaluation and management;Other:  Lenell Antu 01/17/2022, 8:06 PM

## 2022-01-17 NOTE — Progress Notes (Signed)
Daily Progress Note   Patient Name: Nicholas Carson       Date: 01/17/2022 DOB: March 07, 1981  Age: 41 y.o. MRN#: [id number] Attending Physician: Candee Furbish, MD Primary Care Physician: Bartholome Bill, MD Admit Date: 01/23/2022  Reason for Consultation/Follow-up:  "EOL talk"  Patient Profile/HPI: 41 y.o. male  with past medical history of DM2, hypertriglyceridemia, schizophrenia with inpatient admission for psychosis in January 7944, salicylate intoxication requiring intubation in 2018, admitted on 01/16/2022 for cardiac arrest with CPR and ROSC after 20 mins- this was due to seizures in which he had a tongue laceration and aspirated and resulted in respiratory failure. EEG showed continuous generalized background suppression. MRI yesterday shows diffuse anoxic brain injury. Sedation has been stopped and patient remains unresponsive.  Palliative medicine consulted for "EOL talk help".      Subjective: Chart reviewed, patient evaluated.  Patient remains in same state, no changes.  Met again with patient's mother, emotional support provided.  She inquired about transferring patient to [facility]- notes she has spoken to an Scientist, physiological who said they would have a bed for him. I explained process of primary MD calling transfer services at [facility] and discussing patient's case with MD there and then [facility] would decide if they could offer different treatment than what is being done here and transfer would be arranged if they agreed to receive him. She would like to proceed with this process.  She also spoke to the fact that she is still processing patient's state- she feels that she saw him blink his eyes in response to her, felt him squeeze her hand. She is hoping for more improvement- but she also states that  she is preparing to withdraw him from life support and let him go "if it comes to that" and that they are still planning for family meeting on Friday.   Review of Systems  Unable to perform ROS: Acuity of condition     Physical Exam Vitals and nursing note reviewed.  Cardiovascular:     Comments: Diffuse anasarca Pulmonary:     Comments: intubated Neurological:     Comments: Responsive only to noxious stimuli             Vital Signs: BP 134/75   Pulse (!) 116   Temp 99.1 F (37.3 C)   Resp (!)  22   Ht $R'6\' 1"'MC$  (1.854 m)   Wt 109.9 kg   SpO2 100%   BMI 31.97 kg/m  SpO2: SpO2: 100 % O2 Device: O2 Device: Ventilator O2 Flow Rate:    Intake/output summary:  Intake/Output Summary (Last 24 hours) at 01/17/2022 1334 Last data filed at 01/17/2022 1140 Gross per 24 hour  Intake 2076.36 ml  Output 1800 ml  Net 276.36 ml   LBM: Last BM Date : 01/15/22 Baseline Weight: Weight: 90 kg Most recent weight: Weight: 109.9 kg       Palliative Assessment/Data: PPS: 10%      Patient Active Problem List   Diagnosis Date Noted   Status epilepticus, generalized convulsive ([place]) 01/13/2022   Tongue laceration 01/13/2022   Hypertriglyceridemia 01/27/2022   Tobacco abuse 01/11/2022   Paranoid schizophrenia ([place]) 27/07/9289   Salicylate intoxication 05/23/2017   Acute kidney injury ([place]) 90/30/1499   Metabolic acidosis due to salicylate 69/24/9324   Leukocytosis 05/23/2017   Hyperglycemia 05/23/2017   Diabetes ([place]) 05/23/2017   Acute respiratory failure with hypoxia ([place]) 05/23/2017   On mechanically assisted ventilation ([place]) 05/23/2017   Encounter for intubation     Palliative Care Assessment & Plan    Assessment/Recommendations/Plan  Continue current plan of care PMT will follow and plan for family meeting on Friday Patient's RN to notify MD of families request for transfer evaluation   Code Status: Full code  Prognosis:  Unable to determine  Discharge  Planning: To Be Determined  Care plan was discussed with patient's family and care team.   Thank you for allowing the Palliative Medicine Team to assist in the care of this patient.  Total time: 60 mins  Greater than 50%  of this time was spent counseling and coordinating care related to the above assessment and plan.  Mariana Kaufman, AGNP-C Palliative Medicine   Please contact Palliative Medicine Team phone at [phone] for questions and concerns.

## 2022-01-17 NOTE — Plan of Care (Signed)
   No further neurology recommendations at this time. Family is planning for family meeting on Friday to transition to comfort care and we will be available if needed. Neurology will sign off, please call with any questions.    Elmer Picker, DNP, FNP-BC [facility] Pager: [phone]

## 2022-01-17 NOTE — Progress Notes (Signed)
NAME:  Estle Huguley, MRN:  [id number], DOB:  1980-08-19, LOS: 5 ADMISSION DATE:  01/24/2022 CONSULTATION DATE:  01/14/2022 REFERRING MD:  Kommor - EDP CHIEF COMPLAINT:  Seizures  History of Present Illness:  41 year old man who presented to [facility] ED 6/8 via EMS for seizures. PMHx significant for T2DM (uncontrolled), hypertriglyceridemia, schizophrenia, polysubstance abuse (EtOH, THC, tobacco abuse).  Patient had been drinking with family (two 40oz beers) and became unresponsive. EMS called. On arrival witnessed seizure x 1 minute, IM Versed administered. Tongue laceration sustained during seizure. Patient had witnessed cardiac arrest in ED, received 20 minutes CPR, Epi x 2 with ROSC. Intubated and femoral CVC placed. Labs notable for WBC 22.9, Hgb 15.1 (baseline), Na 119, K 4.2, Bicarb < 7, glucose 232, Cr 1.30 (baseline 0.9), mild transaminitis AST 109, ALT 46. Trop 103. Ethanol level 10, UDS +THC and benzos (administered by EMS). CXR unremarkable, CT Head/Maxillofacial without acute abnormalities; CTA Head/Neck negative. Neurology consulted for seizures; AEDs/EEG ordered. ENT consulted for tongue laceration.  PCCM consulted for ICU admission.  Pertinent Medical History:   Past Medical History:  Diagnosis Date   Diabetes mellitus without complication ([place])    Hypertriglyceridemia 02/01/2022   Paranoid schizophrenia ([place]) 05/26/2017   Tobacco abuse 02/03/2022   Significant Hospital Events: Including procedures, antibiotic start and stop dates in addition to other pertinent events   6/8 - Presented to [facility] ED via EMS for seizures, tongue laceration. Coded in ED with CPR x 20 mins and Epi x 2 with subsequent ROSC. CT Head/Maxillofacial negative for acute abnormalities. CTA Head/Neck negative. Neuro consulted. ENT consulted for tongue lac. 6/9 no seizures on eeg, tongue lac repaired by plastics, some vent dyssynchrony   Interim History / Subjective:  On minimal sedation, near comatose.  Objective:   Blood pressure (!) 162/89, pulse (!) 122, temperature (!) 100.8 F (38.2 C), resp. rate (!) 22, height $RemoveBe'6\' 1"'QLjXEiSuN$  (1.854 m), weight 109.9 kg, SpO2 100 %.    Vent Mode: PRVC FiO2 (%):  [40 %] 40 % Set Rate:  [22 bmp] 22 bmp Vt Set:  [640 mL] 640 mL PEEP:  [5 cmH20] 5 cmH20 Plateau Pressure:  [17 cmH20-18 cmH20] 17 cmH20   Intake/Output Summary (Last 24 hours) at 01/17/2022 0739 Last data filed at 01/17/2022 0600 Gross per 24 hour  Intake 3307.13 ml  Output 1485 ml  Net 1822.13 ml    Filed Weights   01/15/22 0323 01/16/22 0405 01/17/22 0239  Weight: (S) 115.3 kg 111.2 kg 109.9 kg   Physical Examination: Intubated Opens eyes to voice Upward gaze deviation stable Still no motor response to pain for me Triggers vent, +cough/gag No edema  No labs today  Resolved Hospital Problem List:    Assessment & Plan:  Status epilepticus, likely associated with EtOH abuse/hyponatremia Anoxic brain injury- exam and MRI are not encouraging for a meaningful recovery Prolonged cardiac arrest from seizures Probable aspiration pneumonia, hypoxemic respiratory failure Secondary rhabdomyolysis improved Tobacco/THC/alcohol abuse hx DM2- CBGs okay Hx schizophrenia Tongue injury/laceration  - Lung protective tidal volumes limiting driving pressures to < 15cm H2O as able - Sedation titrated to vent compliance and patient comfort using PAD orderset - VAP prevention bundle - Continue family discussions: met yesterday, plan is continued supportive care until family meeting Friday then likely transition to comfort as patient would not have wanted to be kept alive on machines, appreciate continued PMT input  Best Practice: (right click and "Reselect all SmartList Selections" daily)   Diet/type: tubefeeds and NPO w/  meds via tube- cortrak DVT prophylaxis: SQ heparin  GI prophylaxis: PPI Lines: Central line Foley:  Yes, and it is still needed Code Status:  full code Last date of multidisciplinary  goals of care discussion [ongoing]  31 min cc time Erskine Emery MD PCCM

## 2022-01-17 NOTE — Progress Notes (Signed)
Notified MD about patient's temperature of 101.1 (even with ice packs and Tylenol) and blood pressure of 163/86. New orders for Bromocriptine.

## 2022-01-18 DIAGNOSIS — Z7189 Other specified counseling: Secondary | ICD-10-CM | POA: Diagnosis not present

## 2022-01-18 DIAGNOSIS — G931 Anoxic brain damage, not elsewhere classified: Secondary | ICD-10-CM | POA: Diagnosis not present

## 2022-01-18 DIAGNOSIS — J9601 Acute respiratory failure with hypoxia: Secondary | ICD-10-CM | POA: Diagnosis not present

## 2022-01-18 LAB — CBC
HCT: 37 % — ABNORMAL LOW (ref 39.0–52.0)
Hemoglobin: 12.1 g/dL — ABNORMAL LOW (ref 13.0–17.0)
MCH: 29.2 pg (ref 26.0–34.0)
MCHC: 32.7 g/dL (ref 30.0–36.0)
MCV: 89.2 fL (ref 80.0–100.0)
Platelets: 257 10*3/uL (ref 150–400)
RBC: 4.15 MIL/uL — ABNORMAL LOW (ref 4.22–5.81)
RDW: 14 % (ref 11.5–15.5)
WBC: 14.1 10*3/uL — ABNORMAL HIGH (ref 4.0–10.5)
nRBC: 0 % (ref 0.0–0.2)

## 2022-01-18 LAB — GLUCOSE, CAPILLARY
Glucose-Capillary: 111 mg/dL — ABNORMAL HIGH (ref 70–99)
Glucose-Capillary: 118 mg/dL — ABNORMAL HIGH (ref 70–99)
Glucose-Capillary: 129 mg/dL — ABNORMAL HIGH (ref 70–99)
Glucose-Capillary: 146 mg/dL — ABNORMAL HIGH (ref 70–99)
Glucose-Capillary: 146 mg/dL — ABNORMAL HIGH (ref 70–99)
Glucose-Capillary: 163 mg/dL — ABNORMAL HIGH (ref 70–99)
Glucose-Capillary: 167 mg/dL — ABNORMAL HIGH (ref 70–99)

## 2022-01-18 LAB — CULTURE, BLOOD (ROUTINE X 2)
Culture: NO GROWTH
Culture: NO GROWTH
Special Requests: ADEQUATE

## 2022-01-18 LAB — BASIC METABOLIC PANEL
Anion gap: 9 (ref 5–15)
BUN: 12 mg/dL (ref 6–20)
CO2: 26 mmol/L (ref 22–32)
Calcium: 9.1 mg/dL (ref 8.9–10.3)
Chloride: 99 mmol/L (ref 98–111)
Creatinine, Ser: 0.59 mg/dL — ABNORMAL LOW (ref 0.61–1.24)
GFR, Estimated: >60 mL/min (ref >60)
Glucose, Bld: 160 mg/dL — ABNORMAL HIGH (ref 70–99)
Potassium: 4 mmol/L (ref 3.5–5.1)
Sodium: 134 mmol/L — ABNORMAL LOW (ref 135–145)

## 2022-01-18 LAB — MAGNESIUM: Magnesium: 1.9 mg/dL (ref 1.7–2.4)

## 2022-01-18 MED ORDER — MAGNESIUM SULFATE 2 GM/50ML IV SOLN
2.0000 g | Freq: Once | INTRAVENOUS | Status: AC
  Administered 2022-01-18: 2 g via INTRAVENOUS
  Filled 2022-01-18: qty 50

## 2022-01-18 MED ORDER — HYDRALAZINE HCL 20 MG/ML IJ SOLN
5.0000 mg | INTRAMUSCULAR | Status: DC | PRN
  Administered 2022-01-18: 5 mg via INTRAVENOUS
  Filled 2022-01-18 (×2): qty 1

## 2022-01-18 MED ORDER — ACETAMINOPHEN 160 MG/5ML PO SOLN
650.0000 mg | Freq: Four times a day (QID) | ORAL | Status: DC | PRN
  Administered 2022-01-18 – 2022-01-19 (×4): 650 mg
  Filled 2022-01-18 (×5): qty 20.3

## 2022-01-18 MED ORDER — PROSOURCE TF PO LIQD
45.0000 mL | Freq: Every day | ORAL | Status: DC
  Administered 2022-01-19 – 2022-01-20 (×2): 45 mL
  Filled 2022-01-18 (×2): qty 45

## 2022-01-18 MED ORDER — PIVOT 1.5 CAL PO LIQD
1000.0000 mL | ORAL | Status: DC
  Administered 2022-01-18 – 2022-01-20 (×3): 1000 mL

## 2022-01-18 NOTE — Progress Notes (Signed)
Daily Progress Note   Patient Name: Nicholas Carson       Date: 01/18/2022 DOB: [birth year]  Age: 41 y.o. MRN#: [id number] Attending Physician: Lorin Glass, MD Primary Care Physician: Verlon Au, MD Admit Date: 01/18/2022  Reason for Consultation/Follow-up:  "EOL talk"  Patient Profile/HPI: 41 y.o. male  with past medical history of DM2, hypertriglyceridemia, schizophrenia with inpatient admission for psychosis in January 2023, salicylate intoxication requiring intubation in 2018, admitted on 01-18-22 for cardiac arrest with CPR and ROSC after 20 mins- this was due to seizures in which he had a tongue laceration and aspirated and resulted in respiratory failure. EEG showed continuous generalized background suppression. MRI yesterday shows diffuse anoxic brain injury. Sedation has been stopped and patient remains unresponsive.  Palliative medicine consulted for "EOL talk help".      Subjective: Chart reviewed, patient evaluated.   Per chart review he opened eyes to voice today.  He squeezed my hand once, but no further movements.  He remains on the ventilator.  No family at bedside. I called his mom, Clydie Braun.  She inquired about transfer to [facility].  I reviewed with her that transfer was declined by [facility].  She understood.   Review of Systems  Unable to perform ROS: Acuity of condition     Physical Exam Vitals and nursing note reviewed.  Cardiovascular:     Comments: Diffuse anasarca Pulmonary:     Comments: intubated Neurological:     Comments: Minimally responsive             Vital Signs: BP (!) 152/87   Pulse (!) 117   Temp 99.3 F (37.4 C)   Resp (!) 22   Ht 6\' 1"  (1.854 m)   Wt 111.5 kg   SpO2 98%   BMI 32.43 kg/m  SpO2: SpO2: 98 % O2 Device: O2 Device:  Ventilator O2 Flow Rate:    Intake/output summary:  Intake/Output Summary (Last 24 hours) at 01/18/2022 1506 Last data filed at 01/18/2022 1200 Gross per 24 hour  Intake 2207.63 ml  Output 2695 ml  Net -487.37 ml    LBM: Last BM Date : 01/15/22 Baseline Weight: Weight: 90 kg Most recent weight: Weight: 111.5 kg       Palliative Assessment/Data: PPS: 10%    Flowsheet Rows    03/17/22  Most Recent Value  Intake Tab   Referral Department Critical care  Unit at Time of Referral ICU  Date Notified 01/16/22  Palliative Care Type New Palliative care  Reason for referral End of Life Care Assistance  Date of Admission 01/06/2022  Date first seen by Palliative Care 01/16/22  # of days Palliative referral response time 0 Day(s)  # of days IP prior to Palliative referral 4  Clinical Assessment   Psychosocial & Spiritual Assessment   Palliative Care Outcomes        Patient Active Problem List   Diagnosis Date Noted   Status epilepticus, generalized convulsive (H[facility]) 01/13/2022   Tongue laceration 01/13/2022   Hypertriglyceridemia 01/19/2022   Tobacco abuse 02/01/2022   Paranoid schizophrenia (HCC) 05/26/2017   Salicylate intoxication 05/23/2017   Acute kidney injury (HCC) 05/23/2017   Metabolic acidosis due to salicylate 05/23/2017   Leukocytosis 05/23/2017   Hyperglycemia 05/23/2017   Diabetes (HCC) 05/23/2017   Acute respiratory failure with hypoxia (HCC) 05/23/2017   On mechanically assisted ventilation ([facility]CC) 05/23/2017   Encounter for intubation     Palliative Care Assessment & Plan    Assessment/Recommendations/Plan  Continue current plan of care PMT will follow and plan for family meeting on Friday   Code Status: Full code  Prognosis:  Unable to determine  Discharge Planning: To Be Determined  Care plan was discussed with patient's family and care team.   Thank you for allowing the Palliative Medicine Team to assist in the care of this  patient.   Greater than 50%  of this time was spent counseling and coordinating care related to the above assessment and plan.  Ocie Bob, AGNP-C Palliative Medicine   Please contact Palliative Medicine Team phone at [phone] for questions and concerns.

## 2022-01-18 NOTE — Progress Notes (Signed)
NAME:  Nicholas Carson, MRN:  [id number], DOB:  Dec 06, 1980, LOS: 6 ADMISSION DATE:  01/08/2022 CONSULTATION DATE:  01/06/2022 REFERRING MD:  Kommor - EDP CHIEF COMPLAINT:  Seizures  History of Present Illness:  41 year old man who presented to [facility] ED 6/8 via EMS for seizures. PMHx significant for T2DM (uncontrolled), hypertriglyceridemia, schizophrenia, polysubstance abuse (EtOH, THC, tobacco abuse).  Patient had been drinking with family (two 40oz beers) and became unresponsive. EMS called. On arrival witnessed seizure x 1 minute, IM Versed administered. Tongue laceration sustained during seizure. Patient had witnessed cardiac arrest in ED, received 20 minutes CPR, Epi x 2 with ROSC. Intubated and femoral CVC placed. Labs notable for WBC 22.9, Hgb 15.1 (baseline), Na 119, K 4.2, Bicarb < 7, glucose 232, Cr 1.30 (baseline 0.9), mild transaminitis AST 109, ALT 46. Trop 103. Ethanol level 10, UDS +THC and benzos (administered by EMS). CXR unremarkable, CT Head/Maxillofacial without acute abnormalities; CTA Head/Neck negative. Neurology consulted for seizures; AEDs/EEG ordered. ENT consulted for tongue laceration.  PCCM consulted for ICU admission.  Pertinent Medical History:   Past Medical History:  Diagnosis Date   Diabetes mellitus without complication ([place])    Hypertriglyceridemia 02/03/2022   Paranoid schizophrenia ([place]) 05/26/2017   Tobacco abuse 01/25/2022   Significant Hospital Events: Including procedures, antibiotic start and stop dates in addition to other pertinent events   6/8 - Presented to [facility] ED via EMS for seizures, tongue laceration. Coded in ED with CPR x 20 mins and Epi x 2 with subsequent ROSC. CT Head/Maxillofacial negative for acute abnormalities. CTA Head/Neck negative. Neuro consulted. ENT consulted for tongue lac. 6/9 no seizures on eeg, tongue lac repaired by plastics, some vent dyssynchrony   Interim History / Subjective:  No events. Exam unchanged. Fevers are  up.  Objective:  Blood pressure (!) 157/94, pulse (!) 144, temperature (!) 101.7 F (38.7 C), resp. rate (!) 22, height 6\' 1"  (1.854 m), weight 111.5 kg, SpO2 100 %.    Vent Mode: PRVC FiO2 (%):  [30 %-40 %] 30 % Set Rate:  [22 bmp] 22 bmp Vt Set:  [640 mL] 640 mL PEEP:  [5 cmH20] 5 cmH20 Plateau Pressure:  [15 cmH20-18 cmH20] 18 cmH20   Intake/Output Summary (Last 24 hours) at 01/18/2022 0908 Last data filed at 01/18/2022 0800 Gross per 24 hour  Intake 2424.97 ml  Output 3010 ml  Net -585.03 ml    Filed Weights   01/16/22 0405 01/17/22 0239 01/18/22 0424  Weight: 111.2 kg 109.9 kg 111.5 kg   Physical Examination: Intubated Opens eyes to voice Upward gaze deviation stable/unchanged Still no motor response to pain for me Triggers vent, +cough/gag, +pupillary No edema  WBC up slightly Na/Cr stable  Resolved Hospital Problem List:    Assessment & Plan:  Status epilepticus, likely associated with EtOH abuse/hyponatremia Anoxic brain injury- MRI and serial physical exam c/w dismal prognosis Prolonged cardiac arrest from seizures Probable aspiration pneumonia, hypoxemic respiratory failure Secondary rhabdomyolysis improved Tobacco/THC/alcohol abuse hx DM2- CBGs okay Hx schizophrenia Ongoing central fevers Tongue injury/laceration  - Lung protective tidal volumes limiting driving pressures to < 15cm H2O as able - Sedation titrated to vent compliance and patient comfort using PAD orderset - VAP prevention bundle - Restart tylenol ATC, LFTs are not bad, continue cooling blanket, bromocriptine - Continue family discussions: plan for EOL talk tomorrow with entire family, appreciate PMT help  Best Practice: (right click and "Reselect all SmartList Selections" daily)   Diet/type: tubefeeds and NPO w/ meds via tube-  cortrak DVT prophylaxis: SQ heparin  GI prophylaxis: PPI Lines: Central line Foley:  Yes, and it is still needed Code Status:  full code Last date of  multidisciplinary goals of care discussion [ongoing]  32 min cc time Erskine Emery MD PCCM

## 2022-01-18 NOTE — Care Management (Signed)
  Transition of Care [facility]) Screening Note   Patient Details  Name: Nicholas Carson Date of Birth: 1980/11/22   Transition of Care [facility]) CM/SW Contact:    Gala Lewandowsky, RN Phone Number: 01/18/2022, 12:30 PM    Transition of Care Department [facility]) has reviewed the patient. Patient presented with seizures-hx of ETOH abuse. Palliative is following the patient. In rounds Staff RN noted that family meeting will take place on Friday 01-20-22. No TOC needs have been identified at this time. We will continue to monitor patient advancement through interdisciplinary progression rounds. If new patient transition needs arise, please place a TOC consult.

## 2022-01-18 NOTE — Progress Notes (Signed)
Nutrition Brief Note   Discussed pt with RN. Pt's current bottle of Vital 1.5 tube feeding formula is empty. Vital 1.5 tube feeding formula is currently out of stock. RD to adjust tube feeding orders.   Tube feeding via OG tube: - Pivot 1.5 at 60 ml/hr (1440 ml per day) - ProSource TF 45 ml daily   Tube feeding regimen provides 2200 kcal, 146 grams of protein, and 1080 ml of H2O.   RD will continue to follow pt during acute admission. Noted plans for [place] discussions tomorrow, 6/15.   Gustavus Bryant, MS, RD, LDN Inpatient Clinical Dietitian Please see AMiON for contact information.

## 2022-01-19 DIAGNOSIS — J9601 Acute respiratory failure with hypoxia: Secondary | ICD-10-CM | POA: Diagnosis not present

## 2022-01-19 LAB — CBC
HCT: 37.4 % — ABNORMAL LOW (ref 39.0–52.0)
Hemoglobin: 12.5 g/dL — ABNORMAL LOW (ref 13.0–17.0)
MCH: 29.3 pg (ref 26.0–34.0)
MCHC: 33.4 g/dL (ref 30.0–36.0)
MCV: 87.6 fL (ref 80.0–100.0)
Platelets: 274 10*3/uL (ref 150–400)
RBC: 4.27 MIL/uL (ref 4.22–5.81)
RDW: 13.7 % (ref 11.5–15.5)
WBC: 16.8 10*3/uL — ABNORMAL HIGH (ref 4.0–10.5)
nRBC: 0 % (ref 0.0–0.2)

## 2022-01-19 LAB — BASIC METABOLIC PANEL
Anion gap: 11 (ref 5–15)
BUN: 17 mg/dL (ref 6–20)
CO2: 27 mmol/L (ref 22–32)
Calcium: 9.3 mg/dL (ref 8.9–10.3)
Chloride: 96 mmol/L — ABNORMAL LOW (ref 98–111)
Creatinine, Ser: 0.63 mg/dL (ref 0.61–1.24)
GFR, Estimated: >60 mL/min (ref >60)
Glucose, Bld: 180 mg/dL — ABNORMAL HIGH (ref 70–99)
Potassium: 4.4 mmol/L (ref 3.5–5.1)
Sodium: 134 mmol/L — ABNORMAL LOW (ref 135–145)

## 2022-01-19 LAB — MAGNESIUM: Magnesium: 2 mg/dL (ref 1.7–2.4)

## 2022-01-19 LAB — HEPATIC FUNCTION PANEL
ALT: 68 U/L — ABNORMAL HIGH (ref 0–44)
AST: 115 U/L — ABNORMAL HIGH (ref 15–41)
Albumin: 2.6 g/dL — ABNORMAL LOW (ref 3.5–5.0)
Alkaline Phosphatase: 57 U/L (ref 38–126)
Bilirubin, Direct: <0.1 mg/dL (ref 0.0–0.2)
Total Bilirubin: 0.7 mg/dL (ref 0.3–1.2)
Total Protein: 6.9 g/dL (ref 6.5–8.1)

## 2022-01-19 LAB — GLUCOSE, CAPILLARY
Glucose-Capillary: 155 mg/dL — ABNORMAL HIGH (ref 70–99)
Glucose-Capillary: 190 mg/dL — ABNORMAL HIGH (ref 70–99)
Glucose-Capillary: 147 mg/dL — ABNORMAL HIGH (ref 70–99)
Glucose-Capillary: 149 mg/dL — ABNORMAL HIGH (ref 70–99)
Glucose-Capillary: 144 mg/dL — ABNORMAL HIGH (ref 70–99)
Glucose-Capillary: 168 mg/dL — ABNORMAL HIGH (ref 70–99)
Glucose-Capillary: 164 mg/dL — ABNORMAL HIGH (ref 70–99)
Glucose-Capillary: 153 mg/dL — ABNORMAL HIGH (ref 70–99)

## 2022-01-19 MED ORDER — PROPOFOL 1000 MG/100ML IV EMUL
5.0000 ug/kg/min | INTRAVENOUS | Status: DC
  Administered 2022-01-19 (×3): 45 ug/kg/min via INTRAVENOUS
  Administered 2022-01-19: 5 ug/kg/min via INTRAVENOUS
  Administered 2022-01-19 – 2022-01-20 (×8): 45 ug/kg/min via INTRAVENOUS
  Administered 2022-01-21 – 2022-01-22 (×11): 50 ug/kg/min via INTRAVENOUS
  Administered 2022-01-22: 60 ug/kg/min via INTRAVENOUS
  Administered 2022-01-22: 50 ug/kg/min via INTRAVENOUS
  Administered 2022-01-22: 60 ug/kg/min via INTRAVENOUS
  Administered 2022-01-22 (×3): 50 ug/kg/min via INTRAVENOUS
  Administered 2022-01-23 (×4): 60 ug/kg/min via INTRAVENOUS
  Administered 2022-01-25: 10 ug/kg/min via INTRAVENOUS
  Administered 2022-01-25: 20 ug/kg/min via INTRAVENOUS
  Filled 2022-01-19: qty 100
  Filled 2022-01-19: qty 200
  Filled 2022-01-19 (×3): qty 100
  Filled 2022-01-19 (×3): qty 200
  Filled 2022-01-19 (×4): qty 100
  Filled 2022-01-19 (×3): qty 200
  Filled 2022-01-19: qty 100
  Filled 2022-01-19: qty 200
  Filled 2022-01-19 (×3): qty 100
  Filled 2022-01-19: qty 200
  Filled 2022-01-19: qty 100
  Filled 2022-01-19: qty 200
  Filled 2022-01-19 (×3): qty 100

## 2022-01-19 NOTE — Progress Notes (Signed)
NAME:  Nicholas Carson, MRN:  [id number], DOB:  Jan 05, 1981, LOS: 7 ADMISSION DATE:  01/22/2022 CONSULTATION DATE:  01/22/2022 REFERRING MD:  Kommor - EDP CHIEF COMPLAINT:  Seizures  History of Present Illness:  41 year old man who presented to [facility] ED 6/8 via EMS for seizures. PMHx significant for T2DM (uncontrolled), hypertriglyceridemia, schizophrenia, polysubstance abuse (EtOH, THC, tobacco abuse).  Patient had been drinking with family (two 40oz beers) and became unresponsive. EMS called. On arrival witnessed seizure x 1 minute, IM Versed administered. Tongue laceration sustained during seizure. Patient had witnessed cardiac arrest in ED, received 20 minutes CPR, Epi x 2 with ROSC. Intubated and femoral CVC placed. Labs notable for WBC 22.9, Hgb 15.1 (baseline), Na 119, K 4.2, Bicarb < 7, glucose 232, Cr 1.30 (baseline 0.9), mild transaminitis AST 109, ALT 46. Trop 103. Ethanol level 10, UDS +THC and benzos (administered by EMS). CXR unremarkable, CT Head/Maxillofacial without acute abnormalities; CTA Head/Neck negative. Neurology consulted for seizures; AEDs/EEG ordered. ENT consulted for tongue laceration.  PCCM consulted for ICU admission.  Pertinent Medical History:   Past Medical History:  Diagnosis Date   Diabetes mellitus without complication (HCC)    Hypertriglyceridemia 01/22/2022   Paranoid schizophrenia (HCC) 05/26/2017   Tobacco abuse 01/22/22   Significant Hospital Events: Including procedures, antibiotic start and stop dates in addition to other pertinent events   6/8 - Presented to [facility] ED via EMS for seizures, tongue laceration. Coded in ED with CPR x 20 mins and Epi x 2 with subsequent ROSC. CT Head/Maxillofacial negative for acute abnormalities. CTA Head/Neck negative. Neuro consulted. ENT consulted for tongue lac. 6/9 no seizures on eeg, tongue lac repaired by plastics, some vent dyssynchrony   Interim History / Subjective:  No events. Temp curve improved.  Objective:   Blood pressure (!) 101/57, pulse (!) 104, temperature 99.1 F (37.3 C), resp. rate 18, height 6\' 1"  (1.854 m), weight 111.5 kg, SpO2 100 %.    Vent Mode: CPAP;PSV FiO2 (%):  [30 %] 30 % Set Rate:  [22 bmp] 22 bmp Vt Set:  [640 mL] 640 mL PEEP:  [5 cmH20] 5 cmH20 Pressure Support:  [3 cmH20] 3 cmH20 Plateau Pressure:  [12 cmH20-18 cmH20] 18 cmH20   Intake/Output Summary (Last 24 hours) at 01/19/2022 0856 Last data filed at 01/19/2022 0800 Gross per 24 hour  Intake 1534.96 ml  Output 1860 ml  Net -325.04 ml    Filed Weights   01/17/22 0239 01/18/22 0424 01/19/22 0421  Weight: 109.9 kg 111.5 kg 111.5 kg   Physical Examination: Intubated, eyes open but upward gaze No motor response to pain Trace oculocephalic reflex +pupillary reflex Cough/gag and vent triggering intact Ext warm Abdomen soft  Labs reviewed, WBC rising slowly Chemistries benign  Resolved Hospital Problem List:    Assessment & Plan:  Status epilepticus, likely associated with EtOH abuse/hyponatremia Anoxic brain injury- MRI and serial physical exam c/w dismal prognosis Prolonged cardiac arrest from seizures Probable aspiration pneumonia, hypoxemic respiratory failure Secondary rhabdomyolysis improved Tobacco/THC/alcohol abuse hx DM2- CBGs okay Hx schizophrenia Ongoing central fevers Tongue injury/laceration  - Lung protective tidal volumes limiting driving pressures to < 01/21/22 H2O as able - Sedation titrated to vent compliance and patient comfort using PAD orderset: going to increase this a bit w/ propofol given pattern on MRI to reduce any stress/suffering patient may be having - VAP prevention bundle - Tylenol, bromocriptine for central fevers, consider sepsis workup if WBC continues to rise depending on GOC - Continue family discussions: plan  for EOL talk tomorrow with entire family, appreciate PMT help.  From my standpoint he will be vent-SNF for life with current level of consciousness and it  sounds like from my discussions with mother that he would not have wanted that.  Best Practice: (right click and "Reselect all SmartList Selections" daily)   Diet/type: tubefeeds and NPO w/ meds via tube- cortrak DVT prophylaxis: SQ heparin  GI prophylaxis: PPI Lines: Central line Foley:  Yes, and it is still needed Code Status:  full code Last date of multidisciplinary goals of care discussion [ongoing]  31 min cc time Myrla Halsted MD PCCM

## 2022-01-19 NOTE — Progress Notes (Signed)
Nutrition Follow-up  DOCUMENTATION CODES:   Not applicable  INTERVENTION:  Continue tube feeding via OG tube: - Pivot 1.5 at 60 ml/hr (1440 ml per day) - ProSource TF 45 ml daily   Tube feeding regimen provides 2200 kcal, 146 grams of protein, and 1080 ml of H2O.   - Continue MVI with minerals daily per tube  NUTRITION DIAGNOSIS:   Inadequate oral intake related to inability to eat as evidenced by NPO status.  Ongoing  GOAL:   Patient will meet greater than or equal to 90% of their needs  Goal met via tube feeding  MONITOR:   Vent status, Labs, Weight trends, TF tolerance  REASON FOR ASSESSMENT:   Ventilator, Consult Enteral/tube feeding initiation and management  ASSESSMENT:   41 year old male who presented to the ED on 6/08 after being found unresponsive by family with seizure-like activity. Pt experienced cardiac arrest in the ED after severing tongue with copious bleeding and required intubation. PMH of paranoid schizophrenia, medication noncompliance, T2DM, asthma, EtOH abuse (at least 80 ounces of beer daily), marijuana abuse, tobacco abuse. Pt admitted with acute respiratory failure post-seizure and post-arrest, new-onset seizures, AKI.  06/09- cortrak placed (tip gastric)  Tube feeding adjusted to Pivot 1.5 yesterday as Vital 1.5 is out of stock. Spoke with RN who states pt is tolerating tube feeding well.    Per CCM, increasing sedation with propofol given pattern on MRI. Pt continues with fevers, consider sepsis work up if WBC continues to rise dependent upon [place]. Noted plans for family discussion with PMT tomorrow.   Patient is currently intubated on ventilator support MV: 13.4 L/min Temp (24hrs), Avg:98.8 F (37.1 C), Min:97.7 F (36.5 C), Max:99.5 F (37.5 C)  Propofol: 30.1 ml/hr (provides 795 kcal from lipids)  Reviewed pt's wt hx. His wt appears stable at 245 lbs.   Edema: generalized  Medications: folvite, SSI 0-9 units Q4H, levemir 5 units  BID, MVI, protonix, miralax, senna, thiamine IV drips: fentanyl  Labs: sodium 134, potassium 4.4 (WNL), Mg 2.0  (WNL), AST 115, ALT 68   Diet Order:   Diet Order             Diet NPO time specified  Diet effective now                   EDUCATION NEEDS:   No education needs have been identified at this time  Skin:  Skin Assessment: Reviewed RN Assessment  Last BM:  6/15 (type 7-large via FMS)  Height:   Ht Readings from Last 1 Encounters:  01/11/2022 _0  (1.854 m)    Weight:   Wt Readings from Last 1 Encounters:  01/19/22 111.5 kg    Ideal Body Weight:  83.6 kg  BMI:  Body mass index is 32.43 kg/m.  Estimated Nutritional Needs:   Kcal:  2200-2400  Protein:  130-150 grams  Fluid:  >2.0 L  Clayborne Dana, RDN, LDN Clinical Nutrition

## 2022-01-20 DIAGNOSIS — G40901 Epilepsy, unspecified, not intractable, with status epilepticus: Secondary | ICD-10-CM | POA: Diagnosis not present

## 2022-01-20 DIAGNOSIS — Z515 Encounter for palliative care: Secondary | ICD-10-CM

## 2022-01-20 DIAGNOSIS — G931 Anoxic brain damage, not elsewhere classified: Secondary | ICD-10-CM | POA: Diagnosis not present

## 2022-01-20 DIAGNOSIS — Z66 Do not resuscitate: Secondary | ICD-10-CM | POA: Diagnosis not present

## 2022-01-20 DIAGNOSIS — J9601 Acute respiratory failure with hypoxia: Secondary | ICD-10-CM | POA: Diagnosis not present

## 2022-01-20 DIAGNOSIS — Z7189 Other specified counseling: Secondary | ICD-10-CM | POA: Diagnosis not present

## 2022-01-20 LAB — BASIC METABOLIC PANEL
Anion gap: 10 (ref 5–15)
BUN: 18 mg/dL (ref 6–20)
CO2: 26 mmol/L (ref 22–32)
Calcium: 8.8 mg/dL — ABNORMAL LOW (ref 8.9–10.3)
Chloride: 97 mmol/L — ABNORMAL LOW (ref 98–111)
Creatinine, Ser: 0.62 mg/dL (ref 0.61–1.24)
GFR, Estimated: >60 mL/min (ref >60)
Glucose, Bld: 165 mg/dL — ABNORMAL HIGH (ref 70–99)
Potassium: 4.5 mmol/L (ref 3.5–5.1)
Sodium: 133 mmol/L — ABNORMAL LOW (ref 135–145)

## 2022-01-20 LAB — CBC
HCT: 35.9 % — ABNORMAL LOW (ref 39.0–52.0)
Hemoglobin: 11.7 g/dL — ABNORMAL LOW (ref 13.0–17.0)
MCH: 28.9 pg (ref 26.0–34.0)
MCHC: 32.6 g/dL (ref 30.0–36.0)
MCV: 88.6 fL (ref 80.0–100.0)
Platelets: 282 10*3/uL (ref 150–400)
RBC: 4.05 MIL/uL — ABNORMAL LOW (ref 4.22–5.81)
RDW: 13.5 % (ref 11.5–15.5)
WBC: 15.3 10*3/uL — ABNORMAL HIGH (ref 4.0–10.5)
nRBC: 0 % (ref 0.0–0.2)

## 2022-01-20 LAB — GLUCOSE, CAPILLARY
Glucose-Capillary: 118 mg/dL — ABNORMAL HIGH (ref 70–99)
Glucose-Capillary: 126 mg/dL — ABNORMAL HIGH (ref 70–99)
Glucose-Capillary: 131 mg/dL — ABNORMAL HIGH (ref 70–99)
Glucose-Capillary: 135 mg/dL — ABNORMAL HIGH (ref 70–99)
Glucose-Capillary: 107 mg/dL — ABNORMAL HIGH (ref 70–99)
Glucose-Capillary: 119 mg/dL — ABNORMAL HIGH (ref 70–99)

## 2022-01-20 LAB — MAGNESIUM: Magnesium: 2 mg/dL (ref 1.7–2.4)

## 2022-01-20 LAB — VITAMIN B1: Vitamin B1 (Thiamine): 185.9 nmol/L (ref 66.5–200.0)

## 2022-01-20 LAB — TRIGLYCERIDES: Triglycerides: 111 mg/dL (ref <150)

## 2022-01-20 MED ORDER — CHLORHEXIDINE GLUCONATE 0.12 % MT SOLN
OROMUCOSAL | Status: AC
  Administered 2022-01-20: 15 mL via OROMUCOSAL
  Filled 2022-01-20: qty 30

## 2022-01-20 MED ORDER — CHLORHEXIDINE GLUCONATE 0.12 % MT SOLN
OROMUCOSAL | Status: AC
  Administered 2022-01-21: 15 mL via OROMUCOSAL
  Filled 2022-01-20: qty 15

## 2022-01-20 NOTE — Progress Notes (Signed)
NAME:  Nicholas Carson, MRN:  [id number], DOB:  02-04-1981, LOS: 8 ADMISSION DATE:  01/09/2022 CONSULTATION DATE:  01/22/2022 REFERRING MD:  Kommor - EDP CHIEF COMPLAINT:  Seizures  History of Present Illness:  41 year old man who presented to [facility] ED 6/8 via EMS for seizures. PMHx significant for T2DM (uncontrolled), hypertriglyceridemia, schizophrenia, polysubstance abuse (EtOH, THC, tobacco abuse).  Patient had been drinking with family (two 40oz beers) and became unresponsive. EMS called. On arrival witnessed seizure x 1 minute, IM Versed administered. Tongue laceration sustained during seizure. Patient had witnessed cardiac arrest in ED, received 20 minutes CPR, Epi x 2 with ROSC. Intubated and femoral CVC placed. Labs notable for WBC 22.9, Hgb 15.1 (baseline), Na 119, K 4.2, Bicarb < 7, glucose 232, Cr 1.30 (baseline 0.9), mild transaminitis AST 109, ALT 46. Trop 103. Ethanol level 10, UDS +THC and benzos (administered by EMS). CXR unremarkable, CT Head/Maxillofacial without acute abnormalities; CTA Head/Neck negative. Neurology consulted for seizures; AEDs/EEG ordered. ENT consulted for tongue laceration.  PCCM consulted for ICU admission.  Pertinent Medical History:   Past Medical History:  Diagnosis Date   Diabetes mellitus without complication (HCC)    Hypertriglyceridemia 01/27/2022   Paranoid schizophrenia (HCC) 05/26/2017   Tobacco abuse 01/07/2022   Significant Hospital Events: Including procedures, antibiotic start and stop dates in addition to other pertinent events   6/8 - Presented to [facility] ED via EMS for seizures, tongue laceration. Coded in ED with CPR x 20 mins and Epi x 2 with subsequent ROSC. CT Head/Maxillofacial negative for acute abnormalities. CTA Head/Neck negative. Neuro consulted. ENT consulted for tongue lac. 6/9 no seizures on eeg, tongue lac repaired by plastics, some vent dyssynchrony   Interim History / Subjective:  No overnight events. Patient reported to have rigid  movements with passive motion. Appear uncomfortable with eye twitching, tachycardia, tachypnea and hypertension when sedation is held.  He does breathe spontaneously on the ventilator  Objective:  Blood pressure 140/62, pulse (!) 103, temperature 99.1 F (37.3 C), temperature source Esophageal, resp. rate (!) 21, height 6\' 1"  (1.854 m), weight 114.7 kg, SpO2 97 %.    Vent Mode: PSV;CPAP FiO2 (%):  [30 %] 30 % Set Rate:  [22 bmp] 22 bmp Vt Set:  [640 mL] 640 mL PEEP:  [5 cmH20] 5 cmH20 Pressure Support:  [3 cmH20] 3 cmH20 Plateau Pressure:  [17 cmH20-18 cmH20] 18 cmH20   Intake/Output Summary (Last 24 hours) at 01/20/2022 1027 Last data filed at 01/20/2022 1000 Gross per 24 hour  Intake 3250.93 ml  Output 1660 ml  Net 1590.93 ml   Filed Weights   01/18/22 0424 01/19/22 0421 01/20/22 0200  Weight: 111.5 kg 111.5 kg 114.7 kg   Physical Examination: Gen:      Intubated, sedated, acutely ill appearing HEENT:  ETT to vent Lungs:    sounds of mechanical ventilation auscultated no wheezes CV:         tachycardic, hypertensive, regular Abd:      + bowel sounds; soft, non-tender; no palpable masses, no distension Ext:    No edema Skin:      Warm and dry; no rashes Neuro:   sedated, no spontaneous movements does breathe spontaneously with cough and gag.    Resolved Hospital Problem List:    Assessment & Plan:  Status epilepticus, likely associated with EtOH abuse/hyponatremia Anoxic brain injury- MRI and serial physical exam c/w dismal prognosis Prolonged cardiac arrest from seizures Probable aspiration pneumonia, hypoxemic respiratory failure Secondary rhabdomyolysis improved Tobacco/THC/alcohol  abuse hx DM2- CBGs okay Hx schizophrenia Ongoing central fevers Tongue injury/laceration  - Lung protective tidal volumes limiting driving pressures to < 29JM H2O as able - continue sedation for comfort and suppression of unstable vital signs.  - VAP prevention bundle - Tylenol,  bromocriptine for central fevers, consider sepsis workup if WBC continues to rise depending on GOC - Continue family discussions: plan for EOL talk tomorrow with entire family, appreciate PMT help.  Family meeting today. He is not brain dead but does have severe anoxic encephalopathy that make it unlikely to return to any quality of independent life outside a healthcare setting.  Best Practice: (right click and "Reselect all SmartList Selections" daily)   Diet/type: tubefeeds and NPO w/ meds via tube- cortrak DVT prophylaxis: SQ heparin  GI prophylaxis: PPI Lines: Central line Foley:  Yes, and it is still needed Code Status:  full code Last date of multidisciplinary goals of care discussion [pending family meeting today.]  The patient is critically ill due to anoxic encephalopathy, respiratory failure, cardiac arrest.  Critical care was necessary to treat or prevent imminent or life-threatening deterioration.  Critical care was time spent personally by me on the following activities: development of treatment plan with patient and/or surrogate as well as nursing, discussions with consultants, evaluation of patient's response to treatment, examination of patient, obtaining history from patient or surrogate, ordering and performing treatments and interventions, ordering and review of laboratory studies, ordering and review of radiographic studies, pulse oximetry, re-evaluation of patient's condition and participation in multidisciplinary rounds.   Critical Care Time devoted to patient care services described in this note is 31 minutes. This time reflects time of care of this signee Charlott Holler . This critical care time does not reflect separately billable procedures or procedure time, teaching time or supervisory time of PA/NP/Med student/Med Resident etc but could involve care discussion time.       Charlott Holler [place] [facility] 01/20/2022 10:27 AM  Pager: see  AMION  If no response to pager , please call critical care on call (see AMION) until 7pm After 7:00 pm call Elink

## 2022-01-20 NOTE — Progress Notes (Signed)
Pt placed back on full vent support due to increased WOB. Pt tolerating well at this time, RT will monitor.

## 2022-01-20 NOTE — Progress Notes (Signed)
Palliative Medicine Inpatient Follow Up Note   HPI: 41 y.o. male  with past medical history of DM2, hypertriglyceridemia, schizophrenia with inpatient admission for psychosis in January 2023, salicylate intoxication requiring intubation in 2018, admitted on 01/18/2022 for cardiac arrest with CPR and ROSC after 20 mins- this was due to seizures in which he had a tongue laceration and aspirated and resulted in respiratory failure. EEG showed continuous generalized background suppression. MRI yesterday shows diffuse anoxic brain injury. Sedation has been stopped and patient remains unresponsive.  Palliative medicine consulted for "EOL talk help".    Today's Discussion 01/20/2022  Family meeting held this afternoon with patient's mother Nicholas Carson, aunt Nicholas Carson, Sister Nicholas Carson, on Charolotte Carson, and grandmother Nicholas Carson.  *Please note that this is a verbal dictation therefore any spelling or grammatical errors are due to the "[employer]" system interpretation.  Chart reviewed inclusive of vital signs, progress notes, laboratory results, and diagnostic images.   I asked Nicholas Carson if she understood the present situation with her son Nicholas Carson.  We reviewed that he had undergone CPR which resulted in significant anoxic brain injury and unfortunately because of this he is now in an unresponsive state whereby he is dependent upon full ventilatory support.  We discussed that this is overall very poor from a prognostic indicator perspective.  This information would indicate that Jarius cannot live off of artificial life support and that if the desire is to continue with him living he would need a tracheostomy and a gastrostomy tube.  We reviewed that he would likely then require mechanical ventilatory support for the duration of his life.  Patient's mother expresses very clearly that she is spoken with her family and friends and everyone is in agreement that this is not what they would want to do.   Nicholas Carson expresses that [place] has suffered tremendously in his life and she does not want to prolong the suffering that he is going through presently.  She is very tearful during her vocalization of the direction she would like to go with [name]'s care. Created space and opportunity for patient to explore thoughts feelings and fears regarding current medical situation.  Regarding [name]'s resuscitation status-patient's mother shares when God takes him she does not wish to have life-saving measures instituted such as CPR.  She shares if it is his time to go then he is "got to go".  Nicholas Carson expresses a great degree of faith in Barron's current medical situation and feels that God is calling him and she has to allow him to move on.  Nicholas Carson requests the next 2 days to allow friends and family to visit and on Sunday she shares she will further address when compassionate extubation will be pursued.  Nicholas Carson request that patient's brother is able to attend funeral services.  She shares she will get the prison information to provide to the palliative care team.  A prayer was held with patient's chaplain, Mr. Homero Fellers and family support was provided.  Questions and concerns addressed   Palliative Support Provided  Objective Assessment: Vital Signs Vitals:   01/20/22 1406 01/20/22 1430  BP:  134/83  Pulse: 93 (!) 107  Resp: (!) 22 (!) 22  Temp:    SpO2: 98% 96%    Intake/Output Summary (Last 24 hours) at 01/20/2022 1526 Last data filed at 01/20/2022 1400 Gross per 24 hour  Intake 2744.52 ml  Output 1650 ml  Net 1094.52 ml   Last Weight  Most recent update: 01/20/2022  2:42  AM    Weight  114.7 kg (252 lb 13.9 oz)            Gen: Middle-age man HEENT: ETT, cortrak, dry mucous membranes CV: Irregular rate and rhythm PULM: On mechanical ventilation ABD: soft/nontender EXT: (+) edema Neuro: Somnolent  SUMMARY OF RECOMMENDATIONS   DNAR  Plan for patient's family to visit over the next 2  days  On Sunday patient's mother, Nicholas Carson will alert the palliative care team as to when compassionate extubation will be pursued  Nicholas Carson request that her son who is imprisoned is requested to leave for Dickie's funeral, we will request our chaplain team work on this  Ongoing palliative support  Billing based on MDM: High  Problems Addressed: One acute or chronic illness or injury that poses a threat to life or bodily function  Amount and/or Complexity of Data: Category 3:Discussion of management or test interpretation with external physician/other qualified health care professional/appropriate source (not separately reported)  Risks: Decision not to resuscitate or to de-escalate care because of poor prognosis  Total Time: 65 ______________________________________________________________________________________ Lamarr Lulas [place] [facility]iative [facility] Team Team Cell Phone: [phone] Please utilize secure chat with additional questions, if there is no response within 30 minutes please call the above phone number  Palliative Medicine Team providers are available by phone from 7am to 7pm daily and can be reached through the team cell phone.  Should this patient require assistance outside of these hours, please call the patient's attending physician.

## 2022-01-21 ENCOUNTER — Inpatient Hospital Stay (HOSPITAL_COMMUNITY): Payer: Medicare Other

## 2022-01-21 DIAGNOSIS — Z515 Encounter for palliative care: Secondary | ICD-10-CM | POA: Diagnosis not present

## 2022-01-21 DIAGNOSIS — G931 Anoxic brain damage, not elsewhere classified: Secondary | ICD-10-CM | POA: Diagnosis not present

## 2022-01-21 DIAGNOSIS — Z7189 Other specified counseling: Secondary | ICD-10-CM | POA: Diagnosis not present

## 2022-01-21 DIAGNOSIS — J9601 Acute respiratory failure with hypoxia: Secondary | ICD-10-CM | POA: Diagnosis not present

## 2022-01-21 DIAGNOSIS — R569 Unspecified convulsions: Secondary | ICD-10-CM | POA: Diagnosis not present

## 2022-01-21 LAB — BASIC METABOLIC PANEL
Anion gap: 10 (ref 5–15)
BUN: 15 mg/dL (ref 6–20)
CO2: 23 mmol/L (ref 22–32)
Calcium: 8.7 mg/dL — ABNORMAL LOW (ref 8.9–10.3)
Chloride: 97 mmol/L — ABNORMAL LOW (ref 98–111)
Creatinine, Ser: 0.69 mg/dL (ref 0.61–1.24)
GFR, Estimated: >60 mL/min (ref >60)
Glucose, Bld: 111 mg/dL — ABNORMAL HIGH (ref 70–99)
Potassium: 3.7 mmol/L (ref 3.5–5.1)
Sodium: 130 mmol/L — ABNORMAL LOW (ref 135–145)

## 2022-01-21 LAB — CBC
HCT: 31.7 % — ABNORMAL LOW (ref 39.0–52.0)
Hemoglobin: 10.3 g/dL — ABNORMAL LOW (ref 13.0–17.0)
MCH: 28.5 pg (ref 26.0–34.0)
MCHC: 32.5 g/dL (ref 30.0–36.0)
MCV: 87.6 fL (ref 80.0–100.0)
Platelets: 288 10*3/uL (ref 150–400)
RBC: 3.62 MIL/uL — ABNORMAL LOW (ref 4.22–5.81)
RDW: 13.7 % (ref 11.5–15.5)
WBC: 14.2 10*3/uL — ABNORMAL HIGH (ref 4.0–10.5)
nRBC: 0 % (ref 0.0–0.2)

## 2022-01-21 LAB — GLUCOSE, CAPILLARY
Glucose-Capillary: 120 mg/dL — ABNORMAL HIGH (ref 70–99)
Glucose-Capillary: 121 mg/dL — ABNORMAL HIGH (ref 70–99)
Glucose-Capillary: 122 mg/dL — ABNORMAL HIGH (ref 70–99)
Glucose-Capillary: 127 mg/dL — ABNORMAL HIGH (ref 70–99)
Glucose-Capillary: 127 mg/dL — ABNORMAL HIGH (ref 70–99)
Glucose-Capillary: 160 mg/dL — ABNORMAL HIGH (ref 70–99)
Glucose-Capillary: 92 mg/dL (ref 70–99)

## 2022-01-21 LAB — MAGNESIUM: Magnesium: 2 mg/dL (ref 1.7–2.4)

## 2022-01-21 MED ORDER — BUSPIRONE HCL 10 MG PO TABS
10.0000 mg | ORAL_TABLET | Freq: Three times a day (TID) | ORAL | Status: DC
  Administered 2022-01-21 – 2022-01-25 (×13): 10 mg
  Filled 2022-01-21 (×13): qty 1

## 2022-01-21 MED ORDER — CHLORHEXIDINE GLUCONATE 0.12 % MT SOLN
OROMUCOSAL | Status: AC
  Administered 2022-01-21: 15 mL via OROMUCOSAL
  Filled 2022-01-21: qty 45

## 2022-01-21 MED ORDER — PIVOT 1.5 CAL PO LIQD
1000.0000 mL | ORAL | Status: DC
  Administered 2022-01-21 – 2022-01-24 (×3): 1000 mL

## 2022-01-21 NOTE — Plan of Care (Signed)
  Problem: Clinical Measurements: Goal: Respiratory complications will improve Outcome: Progressing Goal: Cardiovascular complication will be avoided Outcome: Progressing   Problem: Nutrition: Goal: Adequate nutrition will be maintained Outcome: Not Progressing   

## 2022-01-21 NOTE — Progress Notes (Signed)
NAME:  Nicholas Carson, MRN:  [id number], DOB:  12/16/1980, LOS: 9 ADMISSION DATE:  February 06, 2022 CONSULTATION DATE:  02/06/2022 REFERRING MD:  Kommor - EDP CHIEF COMPLAINT:  Seizures  History of Present Illness:  41 year old man who presented to [facility] ED 6/8 via EMS for seizures. PMHx significant for T2DM (uncontrolled), hypertriglyceridemia, schizophrenia, polysubstance abuse (EtOH, THC, tobacco abuse).  Patient had been drinking with family (two 40oz beers) and became unresponsive. EMS called. On arrival witnessed seizure x 1 minute, IM Versed administered. Tongue laceration sustained during seizure. Patient had witnessed cardiac arrest in ED, received 20 minutes CPR, Epi x 2 with ROSC. Intubated and femoral CVC placed. Labs notable for WBC 22.9, Hgb 15.1 (baseline), Na 119, K 4.2, Bicarb < 7, glucose 232, Cr 1.30 (baseline 0.9), mild transaminitis AST 109, ALT 46. Trop 103. Ethanol level 10, UDS +THC and benzos (administered by EMS). CXR unremarkable, CT Head/Maxillofacial without acute abnormalities; CTA Head/Neck negative. Neurology consulted for seizures; AEDs/EEG ordered. ENT consulted for tongue laceration.  PCCM consulted for ICU admission.  Pertinent Medical History:   Past Medical History:  Diagnosis Date   Diabetes mellitus without complication (HCC)    Hypertriglyceridemia 02-06-2022   Paranoid schizophrenia (HCC) 05/26/2017   Tobacco abuse 2022/02/06   Significant Hospital Events: Including procedures, antibiotic start and stop dates in addition to other pertinent events   6/8 - Presented to [facility] ED via EMS for seizures, tongue laceration. Coded in ED with CPR x 20 mins and Epi x 2 with subsequent ROSC. CT Head/Maxillofacial negative for acute abnormalities. CTA Head/Neck negative. Neuro consulted. ENT consulted for tongue lac. 6/9 no seizures on eeg, tongue lac repaired by plastics, some vent dyssynchrony   Interim History / Subjective:  Yesterday some concern for tube feed emesis. Enteral  meds held. He did need higher doses of sedation overnight due to agitation  Objective:  Blood pressure 127/77, pulse 89, temperature 98.6 F (37 C), temperature source Esophageal, resp. rate (!) 22, height 6\' 1"  (1.854 m), weight 114.2 kg, SpO2 100 %.    Vent Mode: PRVC FiO2 (%):  [30 %] 30 % Set Rate:  [22 bmp] 22 bmp Vt Set:  [640 mL] 640 mL PEEP:  [5 cmH20] 5 cmH20 Plateau Pressure:  [17 cmH20-19 cmH20] 17 cmH20   Intake/Output Summary (Last 24 hours) at 01/21/2022 0840 Last data filed at 01/21/2022 0600 Gross per 24 hour  Intake 2200.22 ml  Output 1480 ml  Net 720.22 ml   Filed Weights   01/19/22 0421 01/20/22 0200 01/21/22 0506  Weight: 111.5 kg 114.7 kg 114.2 kg   Physical Examination: Gen:      Intubated, sedated, acutely ill appearing HEENT:  ETT to vent Lungs:    sounds of mechanical ventilation auscultated  CV:         RRR no mrg Abd:      + bowel sounds; soft, non-tender; no palpable masses, no distension Ext:    No edema Skin:      Warm and dry; no rashes Neuro:   sedated, RASS -4   Resolved Hospital Problem List:    Assessment & Plan:  Status epilepticus, likely associated with EtOH abuse/hyponatremia Anoxic brain injury- MRI and serial physical exam c/w dismal prognosis Prolonged cardiac arrest from seizures Probable aspiration pneumonia, hypoxemic respiratory failure Secondary rhabdomyolysis improved Tobacco/THC/alcohol abuse hx DM2- CBGs okay Hx schizophrenia Ongoing central fevers Tongue injury/laceration  - Lung protective tidal volumes limiting driving pressures to < 01/23/22 H2O as able - continue sedation  for comfort and suppression of unstable vital signs.  - VAP prevention bundle - not appropriate for SBT. - Will obtain KUB to evaluate tube placement so we can give enteral meds. Hold tube feeds right now.  - Tylenol, bromocriptine for central fevers - family intends to transition to comfort measures but timing unclear - waiting for family to be  able to come visit and some relative is in prison that they want to be able to come to the funeral.   Best Practice: (right click and "Reselect all SmartList Selections" daily)   Diet/type: tubefeeds and NPO w/ meds via tube- cortrak DVT prophylaxis: SQ heparin  GI prophylaxis: PPI Lines: Central line Foley:  Yes, and it is still needed Code Status:  full code Last date of multidisciplinary goals of care discussion [6/16 palliative care conversation for eventual transition to comfort measures pending family visits.]  The patient is critically ill due to anoxic encephalopathy, respiratory failure.  Critical care was necessary to treat or prevent imminent or life-threatening deterioration.  Critical care was time spent personally by me on the following activities: development of treatment plan with patient and/or surrogate as well as nursing, discussions with consultants, evaluation of patient's response to treatment, examination of patient, obtaining history from patient or surrogate, ordering and performing treatments and interventions, ordering and review of laboratory studies, ordering and review of radiographic studies, pulse oximetry, re-evaluation of patient's condition and participation in multidisciplinary rounds.   Critical Care Time devoted to patient care services described in this note is 32 minutes. This time reflects time of care of this signee Charlott Holler . This critical care time does not reflect separately billable procedures or procedure time, teaching time or supervisory time of PA/NP/Med student/Med Resident etc but could involve care discussion time.       Charlott Holler [place] [facility] 01/21/2022 8:40 AM  Pager: see AMION  If no response to pager , please call critical care on call (see AMION) until 7pm After 7:00 pm call Elink

## 2022-01-21 NOTE — Progress Notes (Signed)
Palliative Medicine Inpatient Follow Up Note   HPI: 41 y.o. male  with past medical history of DM2, hypertriglyceridemia, schizophrenia with inpatient admission for psychosis in January 6945, salicylate intoxication requiring intubation in 2018, admitted on 01/22/2022 for cardiac arrest with CPR and ROSC after 20 mins- this was due to seizures in which he had a tongue laceration and aspirated and resulted in respiratory failure. EEG showed continuous generalized background suppression. MRI yesterday shows diffuse anoxic brain injury. Sedation has been stopped and patient remains unresponsive.  Palliative medicine consulted for "EOL talk help".    Today's Discussion 01/21/2022  *Please note that this is a verbal dictation therefore any spelling or grammatical errors are due to the "[employer]" system interpretation.  Chart reviewed inclusive of vital signs, progress notes, laboratory results, and diagnostic images.   I met at bedside with Nicholas Carson this morning, RT was placing a bite block.   Nicholas Carson has some fluttering of his eyes but otherwise was not responsive to me.   Per discussion with patients RN, Belenda Cruise patient had an aspirational event yesterday. She held TF as a result of this and requests that NGT medications are held.  I was able to call patients mother Nicholas Carson this morning and explain the aspiration event. She states that she still wants to make sure  [name] gets nutrition therefore I shared that we will provide nutrition at a lower rate to decrease incidents. Also, nonessential medication will be withheld at this time. Nicholas Carson and I discussed her need to continue to get things done and make arrangement prior to compassionate extubation. I shared we are not rushing her by any means though it is no easier the longer it is delayed. I furthermore requested the information from her sons prison to start the process of requesting a temporary release for [name]'s funeral services. Nicholas Carson  shares that she will get that to me once she is home and has it in front of her. Questions and concerns addressed. Palliative Support Provided  Objective Assessment: Vital Signs Vitals:   01/21/22 0718 01/21/22 0804  BP: 127/77   Pulse: 89   Resp: (!) 22   Temp:  98.6 F (37 C)  SpO2: 100%     Intake/Output Summary (Last 24 hours) at 01/21/2022 0844 Last data filed at 01/21/2022 0600 Gross per 24 hour  Intake 2200.22 ml  Output 1480 ml  Net 720.22 ml    Last Weight  Most recent update: 01/21/2022  5:27 AM    Weight  114.2 kg (251 lb 12.3 oz)            Gen: Middle-age man HEENT: ETT, cortrak, dry mucous membranes CV: Irregular rate and rhythm PULM: On mechanical ventilation ABD: soft/nontender EXT: (+) edema Neuro: Somnolent  SUMMARY OF RECOMMENDATIONS   DNAR  Plan for patient's family to visit   On Sunday patient's mother, Nicholas Carson will alert the palliative care team as to when compassionate extubation will be pursued  Nicholas Carson request that her son who is imprisoned is requested to leave for [place] funeral, we will request our chaplain team work on this  Ongoing palliative support  Billing based on MDM: High ______________________________________________________________________________________ [place] Team Team Cell Phone: [phone] Please utilize secure chat with additional questions, if there is no response within 30 minutes please call the above phone number  Palliative Medicine Team providers are available by phone from 7am to 7pm daily and can be reached through the team cell phone.  Should this patient require assistance outside of these hours, please call the patient's attending physician.     

## 2022-01-22 DIAGNOSIS — Z515 Encounter for palliative care: Secondary | ICD-10-CM | POA: Diagnosis not present

## 2022-01-22 DIAGNOSIS — Z7189 Other specified counseling: Secondary | ICD-10-CM | POA: Diagnosis not present

## 2022-01-22 DIAGNOSIS — G40901 Epilepsy, unspecified, not intractable, with status epilepticus: Secondary | ICD-10-CM | POA: Diagnosis not present

## 2022-01-22 DIAGNOSIS — R569 Unspecified convulsions: Secondary | ICD-10-CM | POA: Diagnosis not present

## 2022-01-22 DIAGNOSIS — G931 Anoxic brain damage, not elsewhere classified: Secondary | ICD-10-CM | POA: Diagnosis not present

## 2022-01-22 DIAGNOSIS — J9601 Acute respiratory failure with hypoxia: Secondary | ICD-10-CM | POA: Diagnosis not present

## 2022-01-22 LAB — CBC
HCT: 32.1 % — ABNORMAL LOW (ref 39.0–52.0)
Hemoglobin: 10.5 g/dL — ABNORMAL LOW (ref 13.0–17.0)
MCH: 29.1 pg (ref 26.0–34.0)
MCHC: 32.7 g/dL (ref 30.0–36.0)
MCV: 88.9 fL (ref 80.0–100.0)
Platelets: 331 10*3/uL (ref 150–400)
RBC: 3.61 MIL/uL — ABNORMAL LOW (ref 4.22–5.81)
RDW: 13.9 % (ref 11.5–15.5)
WBC: 14.6 10*3/uL — ABNORMAL HIGH (ref 4.0–10.5)
nRBC: 0 % (ref 0.0–0.2)

## 2022-01-22 LAB — GLUCOSE, CAPILLARY
Glucose-Capillary: 116 mg/dL — ABNORMAL HIGH (ref 70–99)
Glucose-Capillary: 144 mg/dL — ABNORMAL HIGH (ref 70–99)
Glucose-Capillary: 107 mg/dL — ABNORMAL HIGH (ref 70–99)
Glucose-Capillary: 126 mg/dL — ABNORMAL HIGH (ref 70–99)
Glucose-Capillary: 103 mg/dL — ABNORMAL HIGH (ref 70–99)
Glucose-Capillary: 98 mg/dL (ref 70–99)

## 2022-01-22 LAB — BASIC METABOLIC PANEL
Anion gap: 7 (ref 5–15)
BUN: 15 mg/dL (ref 6–20)
CO2: 24 mmol/L (ref 22–32)
Calcium: 8.7 mg/dL — ABNORMAL LOW (ref 8.9–10.3)
Chloride: 103 mmol/L (ref 98–111)
Creatinine, Ser: 0.65 mg/dL (ref 0.61–1.24)
GFR, Estimated: >60 mL/min (ref >60)
Glucose, Bld: 146 mg/dL — ABNORMAL HIGH (ref 70–99)
Potassium: 3.8 mmol/L (ref 3.5–5.1)
Sodium: 134 mmol/L — ABNORMAL LOW (ref 135–145)

## 2022-01-22 LAB — MAGNESIUM: Magnesium: 2.1 mg/dL (ref 1.7–2.4)

## 2022-01-22 MED ORDER — CHLORHEXIDINE GLUCONATE 0.12 % MT SOLN
OROMUCOSAL | Status: AC
  Administered 2022-01-22: 15 mL via OROMUCOSAL
  Filled 2022-01-22: qty 45

## 2022-01-22 MED ORDER — POLYETHYLENE GLYCOL 3350 17 G PO PACK
17.0000 g | PACK | Freq: Once | ORAL | Status: AC
  Administered 2022-01-22: 17 g
  Filled 2022-01-22: qty 1

## 2022-01-22 NOTE — Progress Notes (Signed)
NAME:  Anselm Aumiller, MRN:  [id number], DOB:  10/06/1980, LOS: 10 ADMISSION DATE:  01/23/2022 CONSULTATION DATE:  01/25/2022 REFERRING MD:  Kommor - EDP CHIEF COMPLAINT:  Seizures  History of Present Illness:  41 year old man who presented to [facility] ED 6/8 via EMS for seizures. PMHx significant for T2DM (uncontrolled), hypertriglyceridemia, schizophrenia, polysubstance abuse (EtOH, THC, tobacco abuse).  Patient had been drinking with family (two 40oz beers) and became unresponsive. EMS called. On arrival witnessed seizure x 1 minute, IM Versed administered. Tongue laceration sustained during seizure. Patient had witnessed cardiac arrest in ED, received 20 minutes CPR, Epi x 2 with ROSC. Intubated and femoral CVC placed. Labs notable for WBC 22.9, Hgb 15.1 (baseline), Na 119, K 4.2, Bicarb < 7, glucose 232, Cr 1.30 (baseline 0.9), mild transaminitis AST 109, ALT 46. Trop 103. Ethanol level 10, UDS +THC and benzos (administered by EMS). CXR unremarkable, CT Head/Maxillofacial without acute abnormalities; CTA Head/Neck negative. Neurology consulted for seizures; AEDs/EEG ordered. ENT consulted for tongue laceration.  PCCM consulted for ICU admission.  Pertinent Medical History:   Past Medical History:  Diagnosis Date   Diabetes mellitus without complication (HCC)    Hypertriglyceridemia 02/02/2022   Paranoid schizophrenia (HCC) 05/26/2017   Tobacco abuse 01/07/2022   Significant Hospital Events: Including procedures, antibiotic start and stop dates in addition to other pertinent events   6/8 - Presented to [facility] ED via EMS for seizures, tongue laceration. Coded in ED with CPR x 20 mins and Epi x 2 with subsequent ROSC. CT Head/Maxillofacial negative for acute abnormalities. CTA Head/Neck negative. Neuro consulted. ENT consulted for tongue lac. 6/9 no seizures on eeg, tongue lac repaired by plastics, some vent dyssynchrony   Interim History / Subjective:  Feeding tube appropriately positioned in the  stomach.   Objective:  Blood pressure 118/80, pulse 93, temperature 97.9 F (36.6 C), temperature source Esophageal, resp. rate 20, height 6\' 1"  (1.854 m), weight 114.2 kg, SpO2 98 %.    Vent Mode: PRVC FiO2 (%):  [30 %] 30 % Set Rate:  [22 bmp] 22 bmp Vt Set:  [640 mL] 640 mL PEEP:  [5 cmH20] 5 cmH20 Plateau Pressure:  [16 cmH20-18 cmH20] 18 cmH20   Intake/Output Summary (Last 24 hours) at 01/22/2022 0834 Last data filed at 01/22/2022 0600 Gross per 24 hour  Intake 1755.3 ml  Output 2255 ml  Net -499.7 ml   Filed Weights   01/19/22 0421 01/20/22 0200 01/21/22 0506  Weight: 111.5 kg 114.7 kg 114.2 kg   Physical Examination: Gen:      Intubated, sedated, acutely ill appearing HEENT:  ETT to vent, eyes open but not responsive Lungs:    sounds of mechanical ventilation auscultated  CV:         Tachycardic, regular Abd:      + bowel sounds; soft, non-tender; no palpable masses, no distension Ext:    No edema Skin:      Warm and dry; no rashes Neuro:   sedated, RASS -3, not responsive     Resolved Hospital Problem List:    Assessment & Plan:  Status epilepticus, likely associated with EtOH abuse/hyponatremia Anoxic brain injury- MRI and serial physical exam c/w dismal prognosis Prolonged cardiac arrest from seizures Probable aspiration pneumonia, hypoxemic respiratory failure Secondary rhabdomyolysis improved Tobacco/THC/alcohol abuse hx DM2- CBGs okay Hx schizophrenia Ongoing central fevers Tongue injury/laceration  - Lung protective tidal volumes limiting driving pressures to < 01/23/22 H2O as able - continue sedation for comfort and suppression of unstable  vital signs.  - VAP prevention bundle - not appropriate for SBT. - cortrak in appropriate position, reviewed on KUB. Continuing trickle tube feeds for CBG maintenance and per family request.  - Tylenol, bromocriptine for central fevers - family intends to transition to comfort measures but timing unclear, they will  let us know today when they are planning to transition to comfort measures.  - waiting for family to be able to come visit and the patient's brother is in prison that they want to be able to come to the funeral.   Best Practice: (right click and "Reselect all SmartList Selections" daily)   Diet/type: tubefeeds and NPO w/ meds via tube- cortrak DVT prophylaxis: SQ heparin  GI prophylaxis: PPI Lines: Central line Foley:  Yes, and it is still needed Code Status:  full code Last date of multidisciplinary goals of care discussion [6/16 palliative care conversation for eventual transition to comfort measures pending family visits.]  The patient is critically ill due to respiratory failure, encephalopathy.  Critical care was necessary to treat or prevent imminent or life-threatening deterioration.  Critical care was time spent personally by me on the following activities: development of treatment plan with patient and/or surrogate as well as nursing, discussions with consultants, evaluation of patient's response to treatment, examination of patient, obtaining history from patient or surrogate, ordering and performing treatments and interventions, ordering and review of laboratory studies, ordering and review of radiographic studies, pulse oximetry, re-evaluation of patient's condition and participation in multidisciplinary rounds.   Critical Care Time devoted to patient care services described in this note is 31 minutes. This time reflects time of care of this signee Charlott Holler . This critical care time does not reflect separately billable procedures or procedure time, teaching time or supervisory time of PA/NP/Med student/Med Resident etc but could involve care discussion time.       Charlott Holler [facility] 01/22/2022 8:34 AM  Pager: see AMION  If no response to pager , please call critical care on call (see AMION) until 7pm After 7:00 pm call Elink

## 2022-01-22 NOTE — Progress Notes (Signed)
Palliative Medicine Inpatient Follow Up Note   HPI: 41 y.o. male  with past medical history of DM2, hypertriglyceridemia, schizophrenia with inpatient admission for psychosis in January 2023, salicylate intoxication requiring intubation in 2018, admitted on 01/23/2022 for cardiac arrest with CPR and ROSC after 20 mins- this was due to seizures in which he had a tongue laceration and aspirated and resulted in respiratory failure. EEG showed continuous generalized background suppression. MRI yesterday shows diffuse anoxic brain injury. Sedation has been stopped and patient remains unresponsive.  Palliative medicine consulted for "EOL talk help".    Today's Discussion 01/22/2022  *Please note that this is a verbal dictation therefore any spelling or grammatical errors are due to the "Dragon Medical One" system interpretation.  Chart reviewed inclusive of vital signs, progress notes, laboratory results, and diagnostic images.   I called patients mother, Clydie Braun this morning. She was quite overwhelmed over the phone and shared that she was "praying" to do the right thing. I asked her to tell me more about her feelings whereby she expressed the difficulty of the decision to extubate. She states she knows it is the right decision but is having a hard time letting go of her "gentle giant." I offer support through empathetic listening.   I asked Clydie Braun if I could explain the process to her in terms of transition to comfort care. She said, "not today baby, I can't take it today". I shared that it's important that I verify she understands the process though I respect her need for more time.   We discussed that Clydie Braun I getting the information together for her son's prison to provide to the Palliative team. She believes she will be able to provide that later today.   _______________________________ Addendum:  I checked patients room this evening and Clydie Braun was still not present she shares at 11:15 that she  would be coming to the hospital "right away".  We will continue to check in and offer support.  Objective Assessment: Vital Signs Vitals:   01/22/22 1300 01/22/22 1330  BP: 128/71 126/77  Pulse: 77 96  Resp: (!) 22 (!) 22  Temp:    SpO2: 99% 98%    Intake/Output Summary (Last 24 hours) at 01/22/2022 1357 Last data filed at 01/22/2022 1200 Gross per 24 hour  Intake 1853.11 ml  Output 2350 ml  Net -496.89 ml    Last Weight  Most recent update: 01/21/2022  5:27 AM    Weight  114.2 kg (251 lb 12.3 oz)            Gen: Middle-age man HEENT: ETT, cortrak, dry mucous membranes CV: Irregular rate and rhythm PULM: On mechanical ventilation ABD: soft/nontender EXT: (+) edema Neuro: Somnolent  SUMMARY OF RECOMMENDATIONS   DNAR  Plan for patient's family to visit   Clydie Braun has not come into the hospital today to set a date and time for extubation --> We will reach out again tomorrow in an effort to solidify this  Clydie Braun request that her son who is imprisoned is requested to leave for Alphonse's funeral, we will request our chaplain team work on this  Ongoing palliative support  Billing based on MDM: High ______________________________________________________________________________________ Lamarr Lulas [place] [facility] Team Team Cell Phone: [phone] Please utilize secure chat with additional questions, if there is no response within 30 minutes please call the above phone number  Palliative Medicine Team providers are available by phone from 7am to 7pm daily and can be reached through the team  cell phone.  Should this patient require assistance outside of these hours, please call the patient's attending physician.

## 2022-01-23 DIAGNOSIS — G40901 Epilepsy, unspecified, not intractable, with status epilepticus: Secondary | ICD-10-CM | POA: Diagnosis not present

## 2022-01-23 DIAGNOSIS — I469 Cardiac arrest, cause unspecified: Secondary | ICD-10-CM

## 2022-01-23 DIAGNOSIS — J9601 Acute respiratory failure with hypoxia: Secondary | ICD-10-CM | POA: Diagnosis not present

## 2022-01-23 DIAGNOSIS — Z515 Encounter for palliative care: Secondary | ICD-10-CM | POA: Diagnosis not present

## 2022-01-23 DIAGNOSIS — R569 Unspecified convulsions: Secondary | ICD-10-CM | POA: Diagnosis not present

## 2022-01-23 DIAGNOSIS — G931 Anoxic brain damage, not elsewhere classified: Secondary | ICD-10-CM | POA: Diagnosis not present

## 2022-01-23 LAB — GLUCOSE, CAPILLARY
Glucose-Capillary: 123 mg/dL — ABNORMAL HIGH (ref 70–99)
Glucose-Capillary: 118 mg/dL — ABNORMAL HIGH (ref 70–99)
Glucose-Capillary: 143 mg/dL — ABNORMAL HIGH (ref 70–99)
Glucose-Capillary: 161 mg/dL — ABNORMAL HIGH (ref 70–99)
Glucose-Capillary: 126 mg/dL — ABNORMAL HIGH (ref 70–99)

## 2022-01-23 LAB — TRIGLYCERIDES: Triglycerides: 142 mg/dL (ref <150)

## 2022-01-23 NOTE — Progress Notes (Signed)
Patient ID: Masayuki Sakai, male   DOB: [birth year], 41 y.o.   MRN: [id number]    Progress Note from the Palliative Medicine Team at [facility]   Patient Name: Nicholas Carson        Date: 01/23/2022 DOB: 01/18/1981  Age: 41 y.o. MRN#: [id number] Attending Physician: Jacky Kindle, MD Primary Care Physician: Bartholome Bill, MD Admit Date: 01/27/2022   Medical records reviewed   41 year old male with schizophrenia and polysubstance abuse who had weakness cardiac arrest for 20 minutes before ROSC was achieved, now with severe anoxic/hypoxic brain injury  Family face treatment option decisions, advanced directive decisions and anticipatory care needs.   This NP/ Nicholas Carson visited patient at the bedside as a follow up for palliative medicine needs and emotional support.  Family has met with palliative medicine on multiple occasions.  I am meeting with his family for the first time today  I met with patient's mother Nicholas Carson, 2 months Nicholas Carson and patient's grandmother Nicholas Carson. Nicholas Carson and Nicholas Rad NP present   Continued education regarding current medical situation specific to anoxic brain injury, and the grim prognosis for neurologic recovery.  Created space and opportunity for family to explore the thoughts and feelings regarding current medical situation.  All present verbalize an understanding of the poor prognosis and support decision to liberate patient from the ventilator allowing "God's will" and a natural death.  Patient's mother describes Nicholas Carson as a "gentle giant" and a sweet soul.   Patient's mother verbalizes that this Wednesday, 01/25/2022, her daughter Nicholas Carson will be present for a liberation from the ventilator.  Family is requesting autopsy, education offered on logistics of autopsy depending on if it is a medical examiner case or request for private autopsy.  Questions and concerns addressed   PMT will continue to support  holistically  Nicholas Lessen NP  Palliative Medicine Team Team Phone # [phone] Pager [phone]

## 2022-01-23 NOTE — Progress Notes (Signed)
NAME:  Nicholas Carson, MRN:  [id number], DOB:  [birth year], LOS: 11 ADMISSION DATE:  01/29/2022 CONSULTATION DATE:  01/26/2022 REFERRING MD:  Kommor - EDP CHIEF COMPLAINT:  Seizures  History of Present Illness:  41 year old man who presented to [facility] ED 6/8 via EMS for seizures. PMHx significant for T2DM (uncontrolled), hypertriglyceridemia, schizophrenia, polysubstance abuse (EtOH, THC, tobacco abuse).  Patient had been drinking with family (two 40oz beers) and became unresponsive. EMS called. On arrival witnessed seizure x 1 minute, IM Versed administered. Tongue laceration sustained during seizure. Patient had witnessed cardiac arrest in ED, received 20 minutes CPR, Epi x 2 with ROSC. Intubated and femoral CVC placed. Labs notable for WBC 22.9, Hgb 15.1 (baseline), Na 119, K 4.2, Bicarb < 7, glucose 232, Cr 1.30 (baseline 0.9), mild transaminitis AST 109, ALT 46. Trop 103. Ethanol level 10, UDS +THC and benzos (administered by EMS). CXR unremarkable, CT Head/Maxillofacial without acute abnormalities; CTA Head/Neck negative. Neurology consulted for seizures; AEDs/EEG ordered. ENT consulted for tongue laceration.  PCCM consulted for ICU admission.  Pertinent Medical History:   Past Medical History:  Diagnosis Date   Diabetes mellitus without complication (HCC)    Hypertriglyceridemia 01/29/2022   Paranoid schizophrenia (HCC) 05/26/2017   Tobacco abuse 01/15/2022   Significant Hospital Events: Including procedures, antibiotic start and stop dates in addition to other pertinent events   6/8 - Presented to [facility] ED via EMS for seizures, tongue laceration. Coded in ED with CPR x 20 mins and Epi x 2 with subsequent ROSC. CT Head/Maxillofacial negative for acute abnormalities. CTA Head/Neck negative. Neuro consulted. ENT consulted for tongue lac. 6/9 no seizures on eeg, tongue lac repaired by plastics, some vent dyssynchrony  6/13 MRI c/w anoxic brain injury  6/16 - DNR  Interim History / Subjective:  No  events.  Awaiting family timing for transition to comfort.   Objective:  Blood pressure 131/73, pulse (!) 105, temperature 98.2 F (36.8 C), temperature source Esophageal, resp. rate (!) 22, height 6\' 1"  (1.854 m), weight 112.2 kg, SpO2 99 %.    Vent Mode: PRVC FiO2 (%):  [30 %] 30 % Set Rate:  [22 bmp] 22 bmp Vt Set:  [640 mL] 640 mL PEEP:  [5 cmH20] 5 cmH20 Plateau Pressure:  [17 cmH20-18 cmH20] 17 cmH20   Intake/Output Summary (Last 24 hours) at 01/23/2022 0943 Last data filed at 01/23/2022 0900 Gross per 24 hour  Intake 2113.87 ml  Output 2170 ml  Net -56.13 ml   Filed Weights   01/20/22 0200 01/21/22 0506 01/23/22 0451  Weight: 114.7 kg 114.2 kg 112.2 kg   Physical Examination: General: critically ill adult male sedated/ intubated on MV  HEENT: MM pink/moist, ETT with orange bite block in place, no tongue protrusion, upward gaze, sluggish pupils, corneals + (weak left at baseline with tissue) Neuro: no response to noxious stimuli motor wise- HR will rise some CV: ST, RR PULM:  MV supported breaths, coarse, scant secretions, +cough, apneic on PSV trial GI: soft, b+, ND, foley- greenish urine Extremities: warm/dry, no LE edema  Skin: no rashes   No labs given plans for transition to comfort measures, no escalation in care.   Resolved Hospital Problem List:    Assessment & Plan:  Status epilepticus, likely associated with EtOH abuse/hyponatremia Anoxic brain injury- MRI and serial physical exam c/w dismal prognosis Prolonged cardiac arrest from seizures Probable aspiration pneumonia, hypoxemic respiratory failure Secondary rhabdomyolysis improved Tobacco/THC/alcohol abuse hx DM2- CBGs okay Hx schizophrenia Ongoing central fevers Tongue injury/laceration  -  Lung protective tidal volumes limiting driving pressures to < 07PX H2O as able - daily PSV trials but not a candidate for extubation.  Given increased sedation for comfort- patient has remained apneic on trials.      - VAP/ PPI - TF via cortrak - continue tylenol, bromocriptine, and cooling blanket for central fevers - Ongoing PMT discussions for timing of transition to comfort measures - DNR  Best Practice: (right click and "Reselect all SmartList Selections" daily)   Diet/type: tubefeeds and NPO w/ meds via tube- cortrak DVT prophylaxis: SQ heparin  GI prophylaxis: PPI Lines: Central line Foley:  Yes, and it is still needed Code Status:  DNR- 01/20/22 Last date of multidisciplinary goals of care discussion- pending today.    CCT: 31 mins   Posey Boyer, ACNP [facility] 01/23/2022, 9:43 AM  See Amion for pager If no response to pager, please call PCCM consult pager After 7:00 pm call Elink

## 2022-01-23 NOTE — Progress Notes (Signed)
This chaplain responded to PMT consult for spiritual care assistance with the Pt. mother's request for the Pt. incarcerated brother to attend the Pt. funeral.   The chaplain was updated by the Pt. RN-Sarah. The chaplain understands the Pt. mother is not at the bedside. The chaplain provided RN education on the need of a prison name and identification of the Pt. brother before being able to provide assistance with the mother's request.  **[id number] This chaplain attempted a phone call to the Pt. mother-Karen's cell phone and home phone. A voice mail was not left.  Chaplain Stephanie Acre [phone]

## 2022-01-23 NOTE — Progress Notes (Addendum)
This chaplain received a returned phone call from the Pt. mother-Nicholas Carson.   The chaplain understands Nicholas Carson plans to visit at 1:30pm today. This chaplain and PMT NP-Mary Larach plan to meet Nicholas Carson at the bedside.  ++[phone] This chaplain joined Critical Care NP-Brooke and PMT NP-Mary in the Pt. family meeting. The Pt. mother-Nicholas Carson was joined by other family members for support.  The chaplain understands the medical team and family are discussing compassionate extubation on Wednesday. Nicholas Carson asked the chaplain to contact [facility], the location of the Pt. brother's incarceration The chaplain understands today is a federal holiday. All communication will be postponed until Tuesday.  The chaplain updated Nicholas Carson on the next steps.  Chaplain Stephanie Acre [phone]

## 2022-01-23 NOTE — Plan of Care (Signed)
  Problem: Fluid Volume: Goal: Ability to maintain a balanced intake and output will improve Outcome: Progressing   Problem: Nutritional: Goal: Maintenance of adequate nutrition will improve Outcome: Progressing   Problem: Clinical Measurements: Goal: Will remain free from infection Outcome: Progressing Goal: Cardiovascular complication will be avoided Outcome: Progressing   Problem: Nutrition: Goal: Adequate nutrition will be maintained Outcome: Progressing   Problem: Elimination: Goal: Will not experience complications related to bowel motility Outcome: Progressing Goal: Will not experience complications related to urinary retention Outcome: Progressing

## 2022-01-23 NOTE — Plan of Care (Signed)
  Problem: Fluid Volume: Goal: Ability to maintain a balanced intake and output will improve Outcome: Progressing   Problem: Tissue Perfusion: Goal: Adequacy of tissue perfusion will improve Outcome: Progressing   

## 2022-01-24 DIAGNOSIS — J9601 Acute respiratory failure with hypoxia: Secondary | ICD-10-CM | POA: Diagnosis not present

## 2022-01-24 DIAGNOSIS — G931 Anoxic brain damage, not elsewhere classified: Secondary | ICD-10-CM | POA: Diagnosis not present

## 2022-01-24 DIAGNOSIS — G40901 Epilepsy, unspecified, not intractable, with status epilepticus: Secondary | ICD-10-CM | POA: Diagnosis not present

## 2022-01-24 DIAGNOSIS — S01512A Laceration without foreign body of oral cavity, initial encounter: Secondary | ICD-10-CM | POA: Diagnosis not present

## 2022-01-24 DIAGNOSIS — Z515 Encounter for palliative care: Secondary | ICD-10-CM | POA: Diagnosis not present

## 2022-01-24 DIAGNOSIS — R569 Unspecified convulsions: Secondary | ICD-10-CM | POA: Diagnosis not present

## 2022-01-24 LAB — GLUCOSE, CAPILLARY
Glucose-Capillary: 116 mg/dL — ABNORMAL HIGH (ref 70–99)
Glucose-Capillary: 124 mg/dL — ABNORMAL HIGH (ref 70–99)
Glucose-Capillary: 127 mg/dL — ABNORMAL HIGH (ref 70–99)
Glucose-Capillary: 134 mg/dL — ABNORMAL HIGH (ref 70–99)
Glucose-Capillary: 136 mg/dL — ABNORMAL HIGH (ref 70–99)
Glucose-Capillary: 136 mg/dL — ABNORMAL HIGH (ref 70–99)
Glucose-Capillary: 152 mg/dL — ABNORMAL HIGH (ref 70–99)

## 2022-01-24 MED ORDER — ORAL CARE MOUTH RINSE
15.0000 mL | OROMUCOSAL | Status: DC
  Administered 2022-01-24 – 2022-01-27 (×38): 15 mL via OROMUCOSAL

## 2022-01-24 MED ORDER — ORAL CARE MOUTH RINSE: 15.0000 mL | OROMUCOSAL | Status: DC | PRN

## 2022-01-24 NOTE — Progress Notes (Signed)
Patient ID: [name], male   DOB: August 06, 1981, 41 y.o.   MRN: [id number]    Progress Note from the Palliative Medicine Team at [facility]   Patient Name: Nicholas Carson        Date: 01/24/2022 DOB: 1981/05/16  Age: 41 y.o. MRN#: [id number] Attending Physician: Cheri Fowler, MD Primary Care Physician: Verlon Au, MD Admit Date: 01/22/22   Medical records reviewed   41 year old male with schizophrenia and polysubstance abuse who had weakness cardiac arrest for 20 minutes before ROSC was achieved, now with severe anoxic/hypoxic brain injury  Meeting yesterday with family and palliative medicine, family verbalized their intention of one-way extubation on Wednesday.  Mother/Nicholas Carson verbalizes that they are waiting for her daughter/ Nicholas Carson to be able to be present with patient for the extubation.  This NP/ Lorinda Creed reviewed medical records, spoke to bedside RN and assessed the patient.  He remains intubated, does not follow commands.  No family at bedside  I spoke with patient's mother / Nicholas Carson by telephone.  She is unable to give me a definitive time that family will be at the bedside tomorrow for liberation from the vent.  I have given her my contact information and encouraged her to call me with the time frame   Continued education offered regarding current medical situation specific to anoxic brain injury, and the grim prognosis for neurologic recovery.  Mother inquired again today about an autopsy.  I spoke to Volney Presser attempting to collect information for the family.  Education offered to  mother,  it will be up to the medical examiner to determine process for autopsy if indicated.  If patient is not a medical examiner case family may request an autopsy paid for privately by family.  Cost of private autopsy is between $6000 and $7000.  Number for nursing to call at time of death in reference to autopsy is [phone].  Autopsy attendant weill assist with  questions at that time.   Questions and concerns addressed    Discussed with bedside RN  PMT will continue to support holistically  Lorinda Creed NP  Palliative Medicine Team Team Phone # [phone] Pager [phone]

## 2022-01-24 NOTE — Plan of Care (Signed)
  Problem: Fluid Volume: Goal: Ability to maintain a balanced intake and output will improve Outcome: Progressing   Problem: Metabolic: Goal: Ability to maintain appropriate glucose levels will improve Outcome: Progressing   Problem: Clinical Measurements: Goal: Cardiovascular complication will be avoided Outcome: Progressing   Problem: Nutrition: Goal: Adequate nutrition will be maintained Outcome: Progressing   Problem: Elimination: Goal: Will not experience complications related to bowel motility Outcome: Progressing Goal: Will not experience complications related to urinary retention Outcome: Progressing

## 2022-01-24 NOTE — Progress Notes (Signed)
NAME:  Nicholas Carson, MRN:  [id number], DOB:  [birth year], LOS: 12 ADMISSION DATE:  17-Jan-2022 CONSULTATION DATE:  Jan 17, 2022 REFERRING MD:  Kommor - EDP CHIEF COMPLAINT:  Seizures  History of Present Illness:  41 year old man who presented to [facility] ED 6/8 via EMS for seizures. PMHx significant for T2DM (uncontrolled), hypertriglyceridemia, schizophrenia, polysubstance abuse (EtOH, THC, tobacco abuse).  Patient had been drinking with family (two 40oz beers) and became unresponsive. EMS called. On arrival witnessed seizure x 1 minute, IM Versed administered. Tongue laceration sustained during seizure. Patient had witnessed cardiac arrest in ED, received 20 minutes CPR, Epi x 2 with ROSC. Intubated and femoral CVC placed. Labs notable for WBC 22.9, Hgb 15.1 (baseline), Na 119, K 4.2, Bicarb < 7, glucose 232, Cr 1.30 (baseline 0.9), mild transaminitis AST 109, ALT 46. Trop 103. Ethanol level 10, UDS +THC and benzos (administered by EMS). CXR unremarkable, CT Head/Maxillofacial without acute abnormalities; CTA Head/Neck negative. Neurology consulted for seizures; AEDs/EEG ordered. ENT consulted for tongue laceration.  PCCM consulted for ICU admission.  Pertinent Medical History:   Past Medical History:  Diagnosis Date   Diabetes mellitus without complication (HCC)    Hypertriglyceridemia January 17, 2022   Paranoid schizophrenia (HCC) 05/26/2017   Tobacco abuse January 17, 2022   Significant Hospital Events: Including procedures, antibiotic start and stop dates in addition to other pertinent events   6/8 - Presented to [facility] ED via EMS for seizures, tongue laceration. Coded in ED with CPR x 20 mins and Epi x 2 with subsequent ROSC. CT Head/Maxillofacial negative for acute abnormalities. CTA Head/Neck negative. Neuro consulted. ENT consulted for tongue lac. 6/9 no seizures on eeg, tongue lac repaired by plastics, some vent dyssynchrony  6/13 MRI c/w anoxic brain injury  6/16 - DNR  Interim History / Subjective:   No  new events Palliative care spoke with patient's mother yesterday, plan is for meeting tomorrow  Objective:  Blood pressure (!) 145/91, pulse 94, temperature 97.9 F (36.6 C), resp. rate (!) 22, height 6\' 1"  (1.854 m), weight 112.2 kg, SpO2 96 %.    Vent Mode: PRVC FiO2 (%):  [30 %] 30 % Set Rate:  [22 bmp] 22 bmp Vt Set:  [640 mL] 640 mL PEEP:  [5 cmH20] 5 cmH20 Plateau Pressure:  [18 cmH20-20 cmH20] 20 cmH20   Intake/Output Summary (Last 24 hours) at 01/24/2022 0951 Last data filed at 01/24/2022 0800 Gross per 24 hour  Intake 853.15 ml  Output 1615 ml  Net -761.85 ml   Filed Weights   01/20/22 0200 01/21/22 0506 01/23/22 0451  Weight: 114.7 kg 114.2 kg 112.2 kg   Physical Examination:   Physical exam: General: Crtitically ill-appearing male, orally intubated, looks diaphoretic HEENT: [place]/AT, eyes anicteric.  ETT and OGT in place Neuro: Eyes closed, does not open, not following commands, pupils 3 mm bilateral sluggish, positive corneal, cough and gag Chest: Coarse breath sounds, no wheezes or rhonchi Heart: Regular rate and rhythm, no murmurs or gallops Abdomen: Soft, nontender, nondistended, bowel sounds present Skin: No rash   Resolved Hospital Problem List:  Rhabdomyolysis  Assessment & Plan:  Status epilepticus, likely associated with EtOH abuse/hyponatremia S/p PEA cardiac arrest Severe anoxic brain injury Anoxic encephalopathy Probable aspiration pneumonia Acute hypoxic respiratory failure Polysubstance abuse Schizophrenia Tongue injury/laceration  No more seizures Continue Keppra Continue lung protective ventilation Continue goals of care discussion, family meeting is scheduled for tomorrow MRI brain and physical exam are consistent with severe anoxic brain injury Continue to titrate down sedation with RASS  goal -1 Probably patient is withdrawing from substances as he looks diaphoretic Closely monitor Palliative care is following  DNR  Best Practice:  (right click and "Reselect all SmartList Selections" daily)   Diet/type: tubefeeds and NPO w/ meds via tube- cortrak DVT prophylaxis: SQ heparin  GI prophylaxis: PPI Lines: Central line Foley:  Yes, and it is still needed Code Status:  DNR- 01/20/22 Last date of multidisciplinary goals of care discussion-family meeting is scheduled for 6/21 with palliative care  Total critical care time: 32 minutes  Performed by: Cheri Fowler   Critical care time was exclusive of separately billable procedures and treating other patients.   Critical care was necessary to treat or prevent imminent or life-threatening deterioration.   Critical care was time spent personally by me on the following activities: development of treatment plan with patient and/or surrogate as well as nursing, discussions with consultants, evaluation of patient's response to treatment, examination of patient, obtaining history from patient or surrogate, ordering and performing treatments and interventions, ordering and review of laboratory studies, ordering and review of radiographic studies, pulse oximetry and re-evaluation of patient's condition.   Cheri Fowler, MD [facility] See Amion for pager If no response to pager, please call [phone] until 7pm After 7pm, Please call E-link [phone]

## 2022-01-24 NOTE — Progress Notes (Addendum)
This chaplain contacted the [facility] at the request of the Pt. mother-Karen. The chaplain understands the family is planning the Pt. compassionate extubation on Wednesday.  Continued communication with the medical team and family to determine a time is appreciated by the chaplain.  The chaplain understands Lt. Laurence Compton is talking to the Pt. incarcerated brother and exploring the appropriate next steps before reconnecting with the chaplain.  **[phone]  This chaplain phoned [facility] for an update. The chaplain understands Lt. Barton left for the day at 3:30pm. The chaplain will try again on Wednesday.  Chaplain Stephanie Acre [phone]

## 2022-01-25 DIAGNOSIS — Z515 Encounter for palliative care: Secondary | ICD-10-CM | POA: Diagnosis not present

## 2022-01-25 DIAGNOSIS — R0609 Other forms of dyspnea: Secondary | ICD-10-CM

## 2022-01-25 DIAGNOSIS — R451 Restlessness and agitation: Secondary | ICD-10-CM | POA: Diagnosis not present

## 2022-01-25 DIAGNOSIS — J9601 Acute respiratory failure with hypoxia: Secondary | ICD-10-CM | POA: Diagnosis not present

## 2022-01-25 DIAGNOSIS — G40901 Epilepsy, unspecified, not intractable, with status epilepticus: Secondary | ICD-10-CM | POA: Diagnosis not present

## 2022-01-25 DIAGNOSIS — R569 Unspecified convulsions: Secondary | ICD-10-CM | POA: Diagnosis not present

## 2022-01-25 DIAGNOSIS — S01512A Laceration without foreign body of oral cavity, initial encounter: Secondary | ICD-10-CM | POA: Diagnosis not present

## 2022-01-25 LAB — GLUCOSE, CAPILLARY
Glucose-Capillary: 112 mg/dL — ABNORMAL HIGH (ref 70–99)
Glucose-Capillary: 100 mg/dL — ABNORMAL HIGH (ref 70–99)

## 2022-01-25 MED ORDER — ACETAMINOPHEN 325 MG PO TABS
650.0000 mg | ORAL_TABLET | Freq: Four times a day (QID) | ORAL | Status: DC | PRN
  Administered 2022-01-25: 650 mg via ORAL
  Filled 2022-01-25: qty 2

## 2022-01-25 MED ORDER — FENTANYL CITRATE PF 50 MCG/ML IJ SOSY
50.0000 ug | PREFILLED_SYRINGE | INTRAMUSCULAR | Status: AC | PRN
  Administered 2022-01-27: 50 ug via INTRAVENOUS
  Filled 2022-01-25: qty 1

## 2022-01-25 MED ORDER — MIDAZOLAM HCL 2 MG/2ML IJ SOLN
2.0000 mg | INTRAMUSCULAR | Status: DC | PRN
  Administered 2022-01-25 (×3): 2 mg via INTRAVENOUS
  Administered 2022-01-27 (×2): 4 mg via INTRAVENOUS
  Filled 2022-01-25: qty 4
  Filled 2022-01-25: qty 2
  Filled 2022-01-25: qty 4
  Filled 2022-01-25: qty 2

## 2022-01-25 MED ORDER — GLYCOPYRROLATE 0.2 MG/ML IJ SOLN: 0.2000 mg | INTRAMUSCULAR | Status: DC | PRN

## 2022-01-25 MED ORDER — POLYVINYL ALCOHOL 1.4 % OP SOLN: 1.0000 [drp] | Freq: Four times a day (QID) | OPHTHALMIC | Status: DC | PRN

## 2022-01-25 MED ORDER — DIPHENHYDRAMINE HCL 50 MG/ML IJ SOLN
25.0000 mg | INTRAMUSCULAR | Status: DC | PRN
  Administered 2022-01-25: 25 mg via INTRAVENOUS
  Filled 2022-01-25: qty 1

## 2022-01-25 MED ORDER — FENTANYL BOLUS VIA INFUSION
100.0000 ug | INTRAVENOUS | Status: DC | PRN
  Administered 2022-01-26: 100 ug via INTRAVENOUS
  Administered 2022-01-27: 400 ug via INTRAVENOUS
  Administered 2022-01-27 (×3): 100 ug via INTRAVENOUS

## 2022-01-25 MED ORDER — GLYCOPYRROLATE 0.2 MG/ML IJ SOLN
0.2000 mg | INTRAMUSCULAR | Status: DC | PRN
  Administered 2022-01-25: 0.2 mg via INTRAVENOUS
  Filled 2022-01-25: qty 1

## 2022-01-25 MED ORDER — GLYCOPYRROLATE 0.2 MG/ML IJ SOLN
0.4000 mg | Freq: Four times a day (QID) | INTRAMUSCULAR | Status: DC
  Administered 2022-01-25 – 2022-01-27 (×7): 0.4 mg via INTRAVENOUS
  Filled 2022-01-25 (×7): qty 2

## 2022-01-25 MED ORDER — FENTANYL 2500MCG IN NS 250ML (10MCG/ML) PREMIX INFUSION
0.0000 ug/h | INTRAVENOUS | Status: DC
  Administered 2022-01-25 – 2022-01-26 (×4): 200 ug/h via INTRAVENOUS
  Administered 2022-01-27: 20 ug/h via INTRAVENOUS
  Administered 2022-01-27: 200 ug/h via INTRAVENOUS
  Filled 2022-01-25 (×5): qty 250

## 2022-01-25 MED ORDER — ACETAMINOPHEN 650 MG RE SUPP: 650.0000 mg | Freq: Four times a day (QID) | RECTAL | Status: DC | PRN

## 2022-01-25 MED ORDER — GLYCOPYRROLATE 1 MG PO TABS: 1.0000 mg | ORAL_TABLET | ORAL | Status: DC | PRN

## 2022-01-25 MED FILL — Fentanyl Citrate Preservative Free (PF) Inj 2500 MCG/50ML: INTRAMUSCULAR | Qty: 50 | Status: AC

## 2022-01-25 MED FILL — Sodium Chloride IV Soln 0.9%: INTRAVENOUS | Qty: 200 | Status: AC

## 2022-01-25 NOTE — Progress Notes (Signed)
RT Note:  Patient extubated to comfort care per order and family wishes.

## 2022-01-25 NOTE — Progress Notes (Signed)
Patient ID: [name], male   DOB: [birth year], 41 y.o.   MRN: [id number]    Progress Note from the Palliative Medicine Team at [facility]   Patient Name: Nicholas Carson        Date: 01/25/2022 DOB: 26-Jan-1981  Age: 41 y.o. MRN#: [id number] Attending Physician: Cheri Fowler, MD Primary Care Physician: Verlon Au, MD Admit Date: 01/27/2022   Medical records reviewed   41 year old male with schizophrenia and polysubstance abuse who had weakness cardiac arrest for 20 minutes before ROSC was achieved, now with severe anoxic/hypoxic brain injury.  This NP/ Lorinda Creed reviewed medical records, spoke to bedside RN and assessed the patient.  He remains intubated, does not follow commands.  A cousin is at bedside, she tells me the patient's mother and other family members are on their way to the hospital to proceed with one-way extubation.  After family arrived to bedside patient was extubated; focus of care is now comfort and dignity.        I returned to the bedside to speak to patient's mother and large family.  Education offered on comfort care approach, utilization of medications, excellent nursing care and family presence.   Symptom management:  -Dyspnea/pain: Fentanyl drip with bolus as written by attending -Agitation: Versed IV 2 to 4 mg every 4 hours as needed written by attending - Terminal secretions-Robinul IV 0.4 mg every 4 hours scheduled  Education offered on the natural trajectory and expectations at end-of-life.   Prognosis is likely hours to days.  Expect hospital death  Number for nursing to call at time of death in reference to autopsy is [phone].  Autopsy attendant will assist with questions at that time.   Questions and concerns addressed    Discussed with bedside RN  PMT will continue to support holistically  Lorinda Creed NP  Palliative Medicine Team Team Phone # [phone] Pager [phone]

## 2022-01-25 NOTE — Progress Notes (Addendum)
NAME:  Nicholas Carson, MRN:  [id number], DOB:  [birth year], LOS: 13 ADMISSION DATE:  01/21/2022 CONSULTATION DATE:  02/02/2022 REFERRING MD:  Kommor - EDP CHIEF COMPLAINT:  Seizures  History of Present Illness:  41 year old man who presented to [facility] ED 6/8 via EMS for seizures. PMHx significant for T2DM (uncontrolled), hypertriglyceridemia, schizophrenia, polysubstance abuse (EtOH, THC, tobacco abuse).  Patient had been drinking with family (two 40oz beers) and became unresponsive. EMS called. On arrival witnessed seizure x 1 minute, IM Versed administered. Tongue laceration sustained during seizure. Patient had witnessed cardiac arrest in ED, received 20 minutes CPR, Epi x 2 with ROSC. Intubated and femoral CVC placed. Labs notable for WBC 22.9, Hgb 15.1 (baseline), Na 119, K 4.2, Bicarb < 7, glucose 232, Cr 1.30 (baseline 0.9), mild transaminitis AST 109, ALT 46. Trop 103. Ethanol level 10, UDS +THC and benzos (administered by EMS). CXR unremarkable, CT Head/Maxillofacial without acute abnormalities; CTA Head/Neck negative. Neurology consulted for seizures; AEDs/EEG ordered. ENT consulted for tongue laceration.  PCCM consulted for ICU admission.  Pertinent Medical History:   Past Medical History:  Diagnosis Date   Diabetes mellitus without complication (HCC)    Hypertriglyceridemia 01/06/2022   Paranoid schizophrenia (HCC) 05/26/2017   Tobacco abuse 01/11/2022   Significant Hospital Events: Including procedures, antibiotic start and stop dates in addition to other pertinent events   6/8 - Presented to [facility] ED via EMS for seizures, tongue laceration. Coded in ED with CPR x 20 mins and Epi x 2 with subsequent ROSC. CT Head/Maxillofacial negative for acute abnormalities. CTA Head/Neck negative. Neuro consulted. ENT consulted for tongue lac. 6/9 no seizures on eeg, tongue lac repaired by plastics, some vent dyssynchrony  6/13 MRI c/w anoxic brain injury  6/16 - DNR  Interim History / Subjective:    Back on propofol overnight for sedation/ SVT  Mother and remainder of family arrived and ready to for one way extubation and transition to comfort care  Objective:  Blood pressure (!) 140/91, pulse (!) 101, temperature 98.6 F (37 C), temperature source Esophageal, resp. rate (!) 22, height 6\' 1"  (1.854 m), weight 107.3 kg, SpO2 96 %.    Vent Mode: PRVC FiO2 (%):  [30 %] 30 % Set Rate:  [22 bmp] 22 bmp Vt Set:  [640 mL] 640 mL PEEP:  [5 cmH20] 5 cmH20 Plateau Pressure:  [17 cmH20-22 cmH20] 22 cmH20   Intake/Output Summary (Last 24 hours) at 01/25/2022 1109 Last data filed at 01/25/2022 0601 Gross per 24 hour  Intake 859.66 ml  Output 1275 ml  Net -415.34 ml   Filed Weights   01/23/22 0451 01/24/22 0500 01/25/22 0531  Weight: 112.2 kg 108.4 kg 107.3 kg   Physical Examination: General:  critically ill adult male in no distress HEENT: MM pink/moist, copious oral secretions, ETT/ cortrak, sharp upward gaze, eyes open with stimulation, forehead diaphoretic at times Neuro: no response to noxious stimuli in extremities, some reflexive grasping in hands, becomes dyssynchronous/ tachycardic with turning/ stimulation  CV: rr, ST PULM:  MV supported breaths, coarse/ scattered rhonchi, some thick secretions, on PSV- apneic at times then at others will initiate breaths GI: soft, bs+ Extremities: warm/dry, generalized edema  Skin: no rashes    Resolved Hospital Problem List:  Rhabdomyolysis  Assessment & Plan:  Status epilepticus, likely associated with EtOH abuse/hyponatremia S/p PEA cardiac arrest Severe anoxic brain injury Anoxic encephalopathy Probable aspiration pneumonia Acute hypoxic respiratory failure Polysubstance abuse Schizophrenia Tongue injury/laceration DNR/ DNI Family at bedside and ready to  proceed with one way extubation/ transition to comfort care.  Orders placed, will continue fentanyl, with prn versed, robinul, tylenol, benadryl.  Hospital death  anticipated.   Ongoing family support Greatly appreciate Palliative care and chaplin assistance.    Best Practice: (right click and "Reselect all SmartList Selections" daily)   Diet/type: NPO DVT prophylaxis: n/a GI prophylaxis: N/A Lines: Central line- leave for comfort Foley:  Yes, and it is still needed- for comfort Code Status:  DNR- 01/20/22   CCT: 45 mins     Posey Boyer, ACNP [facility] 01/25/2022, 11:09 AM  See Amion for pager If no response to pager, please call PCCM consult pager After 7:00 pm call Elink

## 2022-01-25 NOTE — Progress Notes (Signed)
This chaplain is present for F/U EOL spiritual care with the Pt. family. The chaplain introduced herself to the family. The Pt. mother-Nicholas Carson is no longer in the consultation room. Additional family, which includes the Pt. sister-Nicholas Carson are gathered at the bedside. Nicholas Carson shares she has questions for the RN. The family notes the Pt. is resting comfortably and breathing on his own.   The chaplain updated the Pt. RN-Ken of the family's request for a visit.  Chaplain Stephanie Acre [phone]

## 2022-01-25 NOTE — Progress Notes (Signed)
Nutrition Brief Note ? ?Chart reviewed. ?Pt now transitioning to comfort care.  ?No further nutrition interventions planned at this time.  ?Please re-consult as needed.  ? ? Janiece Scovill MS, RDN, LDN, CNSC ?Registered Dietitian III ?Clinical Nutrition ?RD Pager and On-Call Pager Number Located in Amion  ? ? ?

## 2022-01-25 NOTE — Progress Notes (Addendum)
This chaplain checked in with PMT NP-Mary and the Pt. RN-Ken before calling [employer].  The chaplain left a VM for Lt. Laurence Compton with a request for a return phone call.  From the phone call on Tuesday morning, the chaplain understands:  Prison phone number  [phone] Per prison policy, the inmate will be given the choice to "virtually" participate and will be provided a counselor for follow up.  Chaplain Stephanie Acre [phone]

## 2022-01-26 DIAGNOSIS — J9601 Acute respiratory failure with hypoxia: Secondary | ICD-10-CM | POA: Diagnosis not present

## 2022-01-26 DIAGNOSIS — G40901 Epilepsy, unspecified, not intractable, with status epilepticus: Secondary | ICD-10-CM | POA: Diagnosis not present

## 2022-01-26 DIAGNOSIS — S01512A Laceration without foreign body of oral cavity, initial encounter: Secondary | ICD-10-CM | POA: Diagnosis not present

## 2022-01-26 DIAGNOSIS — G931 Anoxic brain damage, not elsewhere classified: Secondary | ICD-10-CM

## 2022-01-26 DIAGNOSIS — R0609 Other forms of dyspnea: Secondary | ICD-10-CM | POA: Diagnosis not present

## 2022-01-26 DIAGNOSIS — E871 Hypo-osmolality and hyponatremia: Secondary | ICD-10-CM | POA: Diagnosis present

## 2022-01-26 DIAGNOSIS — I469 Cardiac arrest, cause unspecified: Secondary | ICD-10-CM | POA: Diagnosis not present

## 2022-01-26 DIAGNOSIS — F1020 Alcohol dependence, uncomplicated: Secondary | ICD-10-CM | POA: Diagnosis present

## 2022-01-26 DIAGNOSIS — Z91199 Patient's noncompliance with other medical treatment and regimen due to unspecified reason: Secondary | ICD-10-CM

## 2022-01-26 DIAGNOSIS — F1990 Other psychoactive substance use, unspecified, uncomplicated: Secondary | ICD-10-CM | POA: Diagnosis present

## 2022-01-26 DIAGNOSIS — Z515 Encounter for palliative care: Secondary | ICD-10-CM | POA: Diagnosis not present

## 2022-01-26 NOTE — Progress Notes (Signed)
Received pt from 2H at this time. Pt unresponsive, foley and flexiseal intact. Will continue to monitor pt.Marland Kitchen

## 2022-01-26 NOTE — Progress Notes (Incomplete)
Patient ID: Nicholas Carson, male   DOB: [birth year], 41 y.o.   MRN: [id number]    Progress Note from the Palliative Medicine Team at [facility]   Patient Name: Nicholas Carson        Date: 01/26/2022 DOB: Oct 14, 1980  Age: 41 y.o. MRN#: [id number] Attending Physician: [place], Eric J, DO Primary Care Physician: Verlon Au, MD Admit Date: 01/13/2022   Medical records reviewed   41 year old male with schizophrenia and polysubstance abuse who had weakness cardiac arrest for 20 minutes before ROSC was achieved, now with severe anoxic/hypoxic brain injury.  This NP/ Lorinda Creed reviewed medical records, spoke to bedside RN and assessed the patient.  He remains intubated, does not follow commands.  A cousin is at bedside, she tells me the patient's mother and other family members are on their way to the hospital to proceed with one-way extubation.  After family arrived to bedside patient was extubated; focus of care is now comfort and dignity.        I returned to the bedside to speak to patient's mother and large family.  Education offered on comfort care approach, utilization of medications, excellent nursing care and family presence.   Symptom management:  -Dyspnea/pain: Fentanyl drip with bolus as written by attending -Agitation: Versed IV 2 to 4 mg every 4 hours as needed written by attending - Terminal secretions-Robinul IV 0.4 mg every 4 hours scheduled  Education offered on the natural trajectory and expectations at end-of-life.   Prognosis is likely hours to days.  Expect hospital death  Number for nursing to call at time of death in reference to autopsy is [phone].  Autopsy attendant will assist with questions at that time.   Questions and concerns addressed    Discussed with bedside RN  PMT will continue to support holistically  Lorinda Creed NP  Palliative Medicine Team Team Phone # [phone] Pager [phone]

## 2022-01-26 NOTE — Progress Notes (Addendum)
This chaplain attempted F/U spiritual care with the Pt. mother-Karen. The chaplain checked in with RN-Tameka before phoning both of Karen's phone numbers. A voice mail was not possible on either number.  This chaplain is available for F/U spiritual care as needed.  **[phone] Pt. mother-Karen returned the chaplain's phone call. The chaplain listened and offered support as Nicholas Carson verbalized the grief she is experiencing in the space of anticipating the loss of her oldest son and answering the family's medical questions. Nicholas Carson requested a clarifying phone call about the dying process from the PMT Friday morning. Nicholas Carson does not want to talk to PMT tonight.  Chaplain Stephanie Acre [phone]

## 2022-01-26 NOTE — Progress Notes (Signed)
PROGRESS NOTE    Nicholas Carson  BOF:[id number] DOB: July 30, 1981 DOA: 2022-01-15 PCP: Verlon Au, MD    Brief Narrative:   Nicholas Carson is a 41 y.o. male with past medical history significant for paranoid schizophrenia, type 2 diabetes mellitus, hypertriglyceridemia, medication nonadherence, and polysubstance abuse who presented to [facility] ED on 6/8 via EMS after being found unresponsive by family members.  Apparently patient states earlier in the day he had been drinking two 40 ounce beers and had passed out.  On EMS arrival EMS witnessed the patient having seizures that lasted approximately 1 minute in which he was given IM midazolam 5 mg.  During the seizure patient suffered a tongue laceration and was subsequently transferred to the ED for further evaluation. On arrival to the ED, patient was noted to be agitated/combative and was given 2 mg IM Ativan.  Once again patient was noted to have a recurrent seizure and given his tongue laceration and coughing up blood, patient was prepared for intubation to maintain his airway.  Shortly thereafter patient's heart rate was noted to be a rapidly decreasing to the point of no palpable pulses; in which ACLS protocol was initiated.  ROSC was achieved roughly after 20 minutes of CPR with 2 rounds of epinephrine required; although he maintained unresponsive and was started on midazolam and propofol.  Femoral CVC placed.  Labs notable on admission for WBC of 22.9, hemoglobin 15.1, sodium 119, potassium 4.2, bicarb less than 7, glucose 232, creatinine 1.30, AST 109, ALT 46, troponin 103, EtOH level 10, UDS positive for THC and benzos.  Chest x-ray unrevealing.  CT head/maxillofacial without acute abnormalities.  CTA head/neck unrevealing.  Neurology was consulted for seizures, ENT consulted for tongue laceration, cardiology consulted for concern of arrhythmia.  Patient was admitted to the intensive care unit under the PCCM service.  Patient was placed on EEG and  started on Keppra.  No further seizures were appreciated on EEG; and sedation was minimalized; although patient remains unresponsive.  MR brain with and without contrast on 6/11 with symmetric restricted diffusion and mild T2 hyperintensity diffusely throughout the cerebral cortex and cerebellum consistent with global anoxic brain injury.  Palliative care was consulted and after further family discussion it was determined to transition his care to comfort measures.  Patient was extubated and transferred to the MedSurg unit on 6/21.  Care was transferred from [facility] to hospitalist service on 01/26/2022 under full comfort measures.  Assessment & Plan:   Status epilepticus, likely associated with the EtOH abuse/hyponatremia PEA cardiac arrest Severe anoxic brain injury Anoxic encephalopathy Probable aspiration pneumonia Acute hypoxic respiratory failure Polysubstance abuse Paranoid schizophrenia Tongue laceration Patient initially presenting to ED after being found unresponsive by family members at home.  Patient with known chronic history of alcohol abuse and paranoid schizophrenia with nonadherence to medical therapies.  Family noted that he appeared to be having seizures and EMS was activated and brought to the ED for further evaluation.  On arrival to the ED patient with recurrence of seizure as well as PEA cardiac arrest in which ROSC was achieved and roughly 20 minutes after administration of 2 rounds of epinephrine.  During these events he unfortunately suffered a tongue laceration as well; which was repaired by plastic surgery Dr Domenica Reamer on 01/13/2022.  He was initially admitted to the intensive care unit under the critical care team with consultations from neurology, cardiology, and palliative care.  Patient was started on Keppra, sedation was minimalized and EEG showed no recurrence  of seizure activity.  Despite this, patient remained unresponsive and MR brain with and without contrast on 01/15/2022  consistent with global anoxic brain injury.  Given lack of improvement, further discussions with family and palliative care as well as the primary critical care team with neurology; family decided to transition care to comfort measures on 01/25/2022. --Fentanyl drip --Robinul 0.4 mg IV QID --Versed 2-4 mg every 4 hours as needed anxiety --Scopolamine patch --Overall very poor/grim prognosis, anticipate in-hospital death   DVT prophylaxis: SCDs Start: 01/05/2022 2132    Code Status: DNR Family Communication: No family present at bedside this morning  Disposition Plan:  Level of care: Med-Surg Status is: Inpatient Remains inpatient appropriate because: Anticipate hospital death    Consultants:  PCCM -signed off 6/22 Neurology Cardiology Plastic surgery  Procedures:  EEG  Antimicrobials:  Unasyn   Subjective: Patient seen examined at bedside, resting comfortably.  Remains on fentanyl drip.  Unresponsive to verbal or tactile stimuli.  No family present.  Overall poor/grim prognosis with anticipation of possible demise.  No acute issues overnight per nursing staff  Objective: Vitals:   01/25/22 2000 01/25/22 2100 01/26/22 0322 01/26/22 0751  BP:   (!) 142/84 140/88  Pulse: 97  (!) 103 (!) 101  Resp: (!) 30 (!) 24 20 16   Temp:   99.2 F (37.3 C) 97.8 F (36.6 C)  TempSrc:   Oral Oral  SpO2:   100% 95%  Weight:      Height:        Intake/Output Summary (Last 24 hours) at 01/26/2022 1106 Last data filed at 01/26/2022 0700 Gross per 24 hour  Intake 479.67 ml  Output 1825 ml  Net -1345.33 ml   Filed Weights   01/23/22 0451 01/24/22 0500 01/25/22 0531  Weight: 112.2 kg 108.4 kg 107.3 kg    Examination:  Physical Exam: GEN: Unresponsive PULM: CTAB w/o wheezes/crackles, normal respiratory effort CV: Tachycardic, regular rate w/o M/G/R GI: abd soft, NTND, NABS, no R/G/M GU: Foley catheter noted MSK: no peripheral edema, right CVC noted right groin NEURO:  Unresponsive PSYCH: normal mood/affect Integumentary: dry/intact, no rashes or wounds    Data Reviewed: I have personally reviewed following labs and imaging studies  CBC: Recent Labs  Lab 01/20/22 0522 01/21/22 0507 01/22/22 0435  WBC 15.3* 14.2* 14.6*  HGB 11.7* 10.3* 10.5*  HCT 35.9* 31.7* 32.1*  MCV 88.6 87.6 88.9  PLT 282 288 [id number]   Basic Metabolic Panel: Recent Labs  Lab 01/20/22 0522 01/21/22 0507 01/22/22 0435  NA 133* 130* 134*  K 4.5 3.7 3.8  CL 97* 97* 103  CO2 26 23 24   GLUCOSE 165* 111* 146*  BUN 18 15 15   CREATININE 0.62 0.69 0.65  CALCIUM 8.8* 8.7* 8.7*  MG 2.0 2.0 2.1   GFR: Estimated Creatinine Clearance: 154.7 mL/min (by C-G formula based on SCr of 0.65 mg/dL). Liver Function Tests: No results for input(s): "AST", "ALT", "ALKPHOS", "BILITOT", "PROT", "ALBUMIN" in the last 168 hours. No results for input(s): "LIPASE", "AMYLASE" in the last 168 hours. No results for input(s): "AMMONIA" in the last 168 hours. Coagulation Profile: No results for input(s): "INR", "PROTIME" in the last 168 hours. Cardiac Enzymes: No results for input(s): "CKTOTAL", "CKMB", "CKMBINDEX", "TROPONINI" in the last 168 hours. BNP (last 3 results) No results for input(s): "PROBNP" in the last 8760 hours. HbA1C: No results for input(s): "HGBA1C" in the last 72 hours. CBG: Recent Labs  Lab 01/24/22 1522 01/24/22 2015 01/24/22 2334 01/25/22 0336 01/25/22  0824  GLUCAP 152* 136* 127* 112* 100*   Lipid Profile: No results for input(s): "CHOL", "HDL", "LDLCALC", "TRIG", "CHOLHDL", "LDLDIRECT" in the last 72 hours. Thyroid Function Tests: No results for input(s): "TSH", "T4TOTAL", "FREET4", "T3FREE", "THYROIDAB" in the last 72 hours. Anemia Panel: No results for input(s): "VITAMINB12", "FOLATE", "FERRITIN", "TIBC", "IRON", "RETICCTPCT" in the last 72 hours. Sepsis Labs: No results for input(s): "PROCALCITON", "LATICACIDVEN" in the last 168 hours.  No results found for  this or any previous visit (from the past 240 hour(s)).       Radiology Studies: No results found.      Scheduled Meds:  glycopyrrolate  0.4 mg Intravenous QID   mouth rinse  15 mL Mouth Rinse Q2H   scopolamine  1 patch Transdermal Q72H   Continuous Infusions:  fentaNYL infusion INTRAVENOUS 200 mcg/hr (01/26/22 0512)     LOS: 14 days    Time spent: 42 minutes spent on chart review, discussion with nursing staff, consultants, updating family and interview/physical exam; more than 50% of that time was spent in counseling and/or coordination of care.    Alvira Philips [place], DO [facility] Available via Epic secure chat 7am-7pm After these hours, please refer to coverage provider listed on [email or web address] 01/26/2022, 11:06 AM

## 2022-01-26 NOTE — Care Management Important Message (Signed)
Important Message  Patient Details  Name: Nicholas Carson MRN: [id number] Date of Birth: September 12, 1980   Medicare Important Message Given:  Yes Patient is at the EOL out of respect for the patient and his family Patient room not entered  and IM will be mail to the patient home address.     Jurgen Groeneveld 01/26/2022, 3:00 PM

## 2022-01-26 NOTE — Progress Notes (Signed)
Wasted 17cc of fentanyl, [employer]ar[employer] manager witness

## 2022-01-27 DIAGNOSIS — E781 Pure hyperglyceridemia: Secondary | ICD-10-CM

## 2022-01-27 DIAGNOSIS — G931 Anoxic brain damage, not elsewhere classified: Secondary | ICD-10-CM | POA: Diagnosis not present

## 2022-01-27 DIAGNOSIS — J9601 Acute respiratory failure with hypoxia: Secondary | ICD-10-CM | POA: Diagnosis not present

## 2022-01-27 DIAGNOSIS — Z720 Tobacco use: Secondary | ICD-10-CM

## 2022-01-27 DIAGNOSIS — Z515 Encounter for palliative care: Secondary | ICD-10-CM | POA: Diagnosis not present

## 2022-01-27 DIAGNOSIS — I469 Cardiac arrest, cause unspecified: Secondary | ICD-10-CM | POA: Diagnosis not present

## 2022-01-27 DIAGNOSIS — F1990 Other psychoactive substance use, unspecified, uncomplicated: Secondary | ICD-10-CM

## 2022-01-27 MED ORDER — MIDAZOLAM HCL 2 MG/2ML IJ SOLN
4.0000 mg | Freq: Once | INTRAMUSCULAR | Status: AC
  Administered 2022-01-27: 4 mg via INTRAVENOUS
  Filled 2022-01-27: qty 4

## 2022-01-27 MED ORDER — LORAZEPAM 2 MG/ML IJ SOLN
0.5000 mg/h | INTRAVENOUS | Status: DC
  Administered 2022-01-27: 2 mg/h via INTRAVENOUS
  Filled 2022-01-27 (×3): qty 25

## 2022-01-27 MED ORDER — MIDAZOLAM HCL 2 MG/2ML IJ SOLN
2.0000 mg | INTRAMUSCULAR | Status: DC
Start: 2022-01-27 — End: 2022-01-27

## 2022-01-27 MED ORDER — SODIUM CHLORIDE 0.9 % IV SOLN
1.0000 mg/h | INTRAVENOUS | Status: DC
  Administered 2022-01-27: 4 mg/h via INTRAVENOUS
  Administered 2022-01-27: 3 mg/h via INTRAVENOUS
  Filled 2022-01-27: qty 2.5
  Filled 2022-01-27: qty 5
  Filled 2022-01-27 (×2): qty 2.5

## 2022-01-27 MED ORDER — KETOROLAC TROMETHAMINE 30 MG/ML IJ SOLN
30.0000 mg | Freq: Three times a day (TID) | INTRAMUSCULAR | Status: DC
Start: 2022-01-27 — End: 2022-01-28
  Administered 2022-01-27 (×2): 30 mg via INTRAVENOUS
  Filled 2022-01-27 (×2): qty 1

## 2022-01-27 MED ORDER — GLYCOPYRROLATE 0.2 MG/ML IJ SOLN
0.4000 mg | INTRAMUSCULAR | Status: DC
  Administered 2022-01-27 (×3): 0.4 mg via INTRAVENOUS
  Filled 2022-01-27 (×4): qty 2

## 2022-01-27 MED ORDER — FENTANYL CITRATE PF 50 MCG/ML IJ SOSY
200.0000 ug | PREFILLED_SYRINGE | Freq: Once | INTRAMUSCULAR | Status: AC
  Administered 2022-01-27: 200 ug via INTRAVENOUS
  Filled 2022-01-27: qty 4

## 2022-01-27 MED ORDER — FENTANYL BOLUS VIA INFUSION
100.0000 ug | INTRAVENOUS | Status: DC | PRN
  Administered 2022-01-27: 100 ug via INTRAVENOUS

## 2022-01-27 MED ORDER — HYDROMORPHONE BOLUS VIA INFUSION
1.0000 mg | INTRAVENOUS | Status: DC | PRN
  Administered 2022-01-27 (×2): 1 mg via INTRAVENOUS

## 2022-01-27 MED ORDER — LORAZEPAM BOLUS VIA INFUSION
1.0000 mg | INTRAVENOUS | Status: DC | PRN
  Administered 2022-01-27: 1 mg via INTRAVENOUS

## 2022-01-28 DIAGNOSIS — I469 Cardiac arrest, cause unspecified: Secondary | ICD-10-CM | POA: Diagnosis not present

## 2022-01-28 DIAGNOSIS — F10288 Alcohol dependence with other alcohol-induced disorder: Secondary | ICD-10-CM

## 2022-01-28 DIAGNOSIS — Z91199 Patient's noncompliance with other medical treatment and regimen due to unspecified reason: Secondary | ICD-10-CM

## 2022-01-28 DIAGNOSIS — J9601 Acute respiratory failure with hypoxia: Secondary | ICD-10-CM | POA: Diagnosis not present

## 2022-01-28 DIAGNOSIS — G931 Anoxic brain damage, not elsewhere classified: Secondary | ICD-10-CM | POA: Diagnosis not present

## 2022-02-04 DEATH — deceased

## 2023-04-30 IMAGING — DX DG ABD PORTABLE 1V
1 series · 1 of 1 positions shown · non-contrast
Comparison: Abdominal radiograph dated January 13, 2022

CLINICAL DATA: Feeding tube placement

EXAM:
PORTABLE ABDOMEN - 1 VIEW

[abdomen]
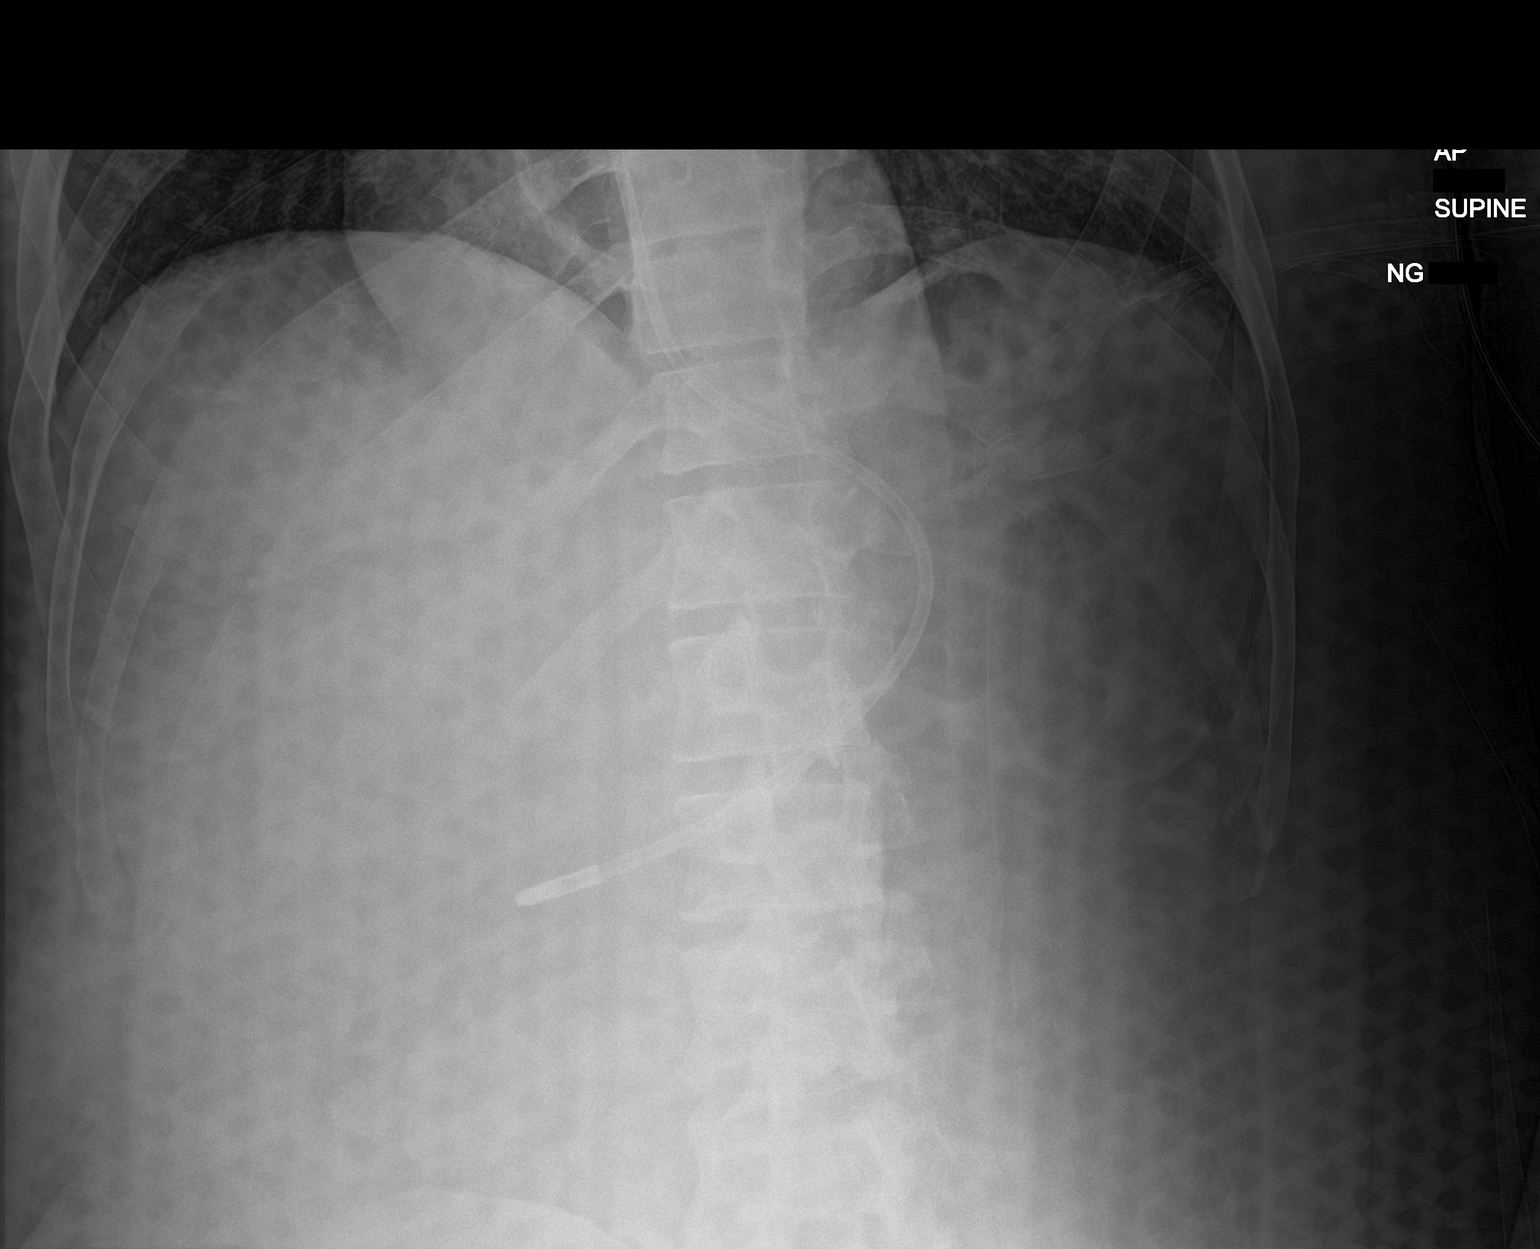

[1 of 1 positions shown; findings below may reference images not displayed]

FINDINGS: Feeding tube tip has been advanced and projects over the expected
area of the distal stomach. No gas-filled dilated loops of bowel
seen in the visualized abdomen. Mild opacities of the left lung
base, likely due to atelectasis.
IMPRESSION: Feeding tube tip projects over the distal stomach.
